# Patient Record
Sex: Female | Born: 2000 | Race: White | Hispanic: No | Marital: Single | Smoking: Former smoker
Health system: Southern US, Community
[De-identification: ages and names within clinical notes are randomized; demographics above are authoritative.]

## PROBLEM LIST (undated history)

## (undated) DIAGNOSIS — F909 Attention-deficit hyperactivity disorder, unspecified type: Secondary | ICD-10-CM

## (undated) DIAGNOSIS — D649 Anemia, unspecified: Secondary | ICD-10-CM

## (undated) DIAGNOSIS — T7840XA Allergy, unspecified, initial encounter: Secondary | ICD-10-CM

## (undated) DIAGNOSIS — D573 Sickle-cell trait: Secondary | ICD-10-CM

## (undated) HISTORY — DX: Allergy, unspecified, initial encounter: T78.40XA

## (undated) HISTORY — PX: NO PAST SURGERIES: SHX2092

## (undated) HISTORY — DX: Attention-deficit hyperactivity disorder, unspecified type: F90.9

---
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
## 2021-02-21 ENCOUNTER — Other Ambulatory Visit: Payer: Self-pay

## 2021-02-21 ENCOUNTER — Telehealth (INDEPENDENT_AMBULATORY_CARE_PROVIDER_SITE_OTHER): Payer: BC Managed Care – PPO | Admitting: Child and Adolescent Psychiatry

## 2021-02-21 DIAGNOSIS — F411 Generalized anxiety disorder: Secondary | ICD-10-CM

## 2021-02-21 MED ORDER — FLUOXETINE HCL 40 MG PO CAPS: ORAL_CAPSULE | ORAL | 0 refills | Status: DC
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
## 2021-03-23 ENCOUNTER — Ambulatory Visit (INDEPENDENT_AMBULATORY_CARE_PROVIDER_SITE_OTHER): Payer: BC Managed Care – PPO

## 2021-03-23 ENCOUNTER — Other Ambulatory Visit: Payer: Self-pay

## 2021-03-23 DIAGNOSIS — Z3042 Encounter for surveillance of injectable contraceptive: Secondary | ICD-10-CM

## 2021-03-23 MED ORDER — MEDROXYPROGESTERONE ACETATE 150 MG/ML IM SUSP
150.0000 mg | Freq: Once | INTRAMUSCULAR | Status: AC
  Administered 2021-03-23: 150 mg via INTRAMUSCULAR

## 2021-03-23 MED ORDER — MEDROXYPROGESTERONE ACETATE 150 MG/ML IM SUSP: 150.0000 mg | Freq: Once | INTRAMUSCULAR | Status: DC

## 2021-03-23 NOTE — Progress Notes (Signed)
Patient presents today for Depo Provera injection within dates. Given IM RUOQ. Patient tolerated well. 

## 2021-04-12 ENCOUNTER — Other Ambulatory Visit: Payer: Self-pay

## 2021-04-12 ENCOUNTER — Telehealth (INDEPENDENT_AMBULATORY_CARE_PROVIDER_SITE_OTHER): Payer: BC Managed Care – PPO | Admitting: Child and Adolescent Psychiatry

## 2021-04-12 DIAGNOSIS — F411 Generalized anxiety disorder: Secondary | ICD-10-CM | POA: Diagnosis not present

## 2021-04-12 NOTE — Progress Notes (Signed)
Virtual Visit via Video Note  I connected with Nicole Branch on 04/12/21 at  8:00 AM EDT by a video enabled telemedicine application and verified that I am speaking with the correct person using two identifiers.  Location: Patient: Home Provider: Office   I discussed the limitations of evaluation and management by telemedicine and the availability of in person appointments. The patient expressed understanding and agreed to proceed.   I discussed the assessment and treatment plan with the patient. The patient was provided an opportunity to ask questions and all were answered. The patient agreed with the plan and demonstrated an understanding of the instructions.   The patient was advised to call back or seek an in-person evaluation if the symptoms worsen or if the condition fails to improve as anticipated.  I provided 20 minutes of non-face-to-face time during this encounter.   Nicole Smalling, MD    [facility] MD/PA/NP OP Progress Note  04/12/2021 8:30 AM Nicole Branch  MRN:  [id number]   Chief Complaint:   Medication management follow-up for anxiety, mood.  HPI: This is a 20 year old Caucasian female with psychiatric history significant of generalized anxiety disorder, ADHD was seen and evaluated over telemedicine encounter for medication management follow-up.   Nicole Branch was accompanied with her mother at her home and was evaluated separately from her mother and jointly.  Nicole Branch gave verbal informed consent to speak with her mother to obtain collateral information and discuss treatment plan.  Her mother also has signed consent from her and will be emailing it to [place].  Nicole Branch denies any new concerns for today's appointment and reports that her brother last increase in Prozac she is feeling better.  When asked what is better she reports that anxiety and stress is less, she has not feeling as "heavy" as before.  She still reports that her anxiety comes back about once a day or every  other day in the context of overthinking especially about her future and over thinking in general.  She reports that she has however noticed a significant difference overall and would like to keep her medication at the same dose for now.  In regards of mood she reports that she is occasionally irritable but not as frequent as it was before.  She reports that she has been sleeping well, her appetite has been good however reports that her energy is still low.  She denies any suicidal thoughts or homicidal thoughts.  She reports that in her free time she hangs out with her friends, with her boyfriend, and has continued to work at [employer].  She denies any problems with her medications.  Her mother reports that she has noticed improvement, reports that Nicole Branch is not as irritable as she was before.  She reports that she has not seen her having outrage or fits of anger as she used to at the last appointment.  She reports that she still has anxiety especially about her future.  We discussed that given stability in her symptoms, continue with current dose of Prozac at 40 mg once a day and follow back and 6 to 8 weeks or earlier if needed.   Visit Diagnosis:    ICD-10-CM   1. Generalized anxiety disorder  F41.1       Past Psychiatric History: reviewed today from last visit and no change.  Past Medical History:  Past Medical History:  Diagnosis Date   ADHD (attention deficit hyperactivity disorder)    Allergies    Anxiety 2019  Asthma 2013   GERD (gastroesophageal reflux disease) 2015   IBS (irritable bowel syndrome) 2021   [facility]   No past surgical history on file.  Family Psychiatric History: As mentioned in initial H&P, reviewed today, no change   Family History:  Family History  Adopted: Yes  Problem Relation Age of Onset   Drug abuse Mother    ADD / ADHD Sister     Social History:  Social History   Socioeconomic History   Marital status: Single    Spouse name: Not on file    Number of children: Not on file   Years of education: Not on file   Highest education level: 9th grade  Occupational History   Not on file  Tobacco Use   Smoking status: Every Day    Packs/day: 0.25    Types: Cigarettes   Smokeless tobacco: Never  Vaping Use   Vaping Use: Never used  Substance and Sexual Activity   Alcohol use: Never    Comment: she does drink    Drug use: Yes    Types: Marijuana    Comment: last week dont want mother to know.    Sexual activity: Yes    Birth control/protection: Injection  Other Topics Concern   Not on file  Social History Narrative   Not on file   Social Determinants of Health   Financial Resource Strain: Not on file  Food Insecurity: Not on file  Transportation Needs: Not on file  Physical Activity: Not on file  Stress: Not on file  Social Connections: Not on file    Allergies: No Known Allergies  Metabolic Disorder Labs: No results found for: HGBA1C, MPG No results found for: PROLACTIN No results found for: CHOL, TRIG, HDL, CHOLHDL, VLDL, LDLCALC No results found for: TSH  Therapeutic Level Labs: No results found for: LITHIUM No results found for: VALPROATE No components found for:  CBMZ  Current Medications: Current Outpatient Medications  Medication Sig Dispense Refill   albuterol (PROVENTIL HFA;VENTOLIN HFA) 108 (90 Base) MCG/ACT inhaler Inhale 2 puffs into the lungs every 6 (six) hours as needed.     FLOVENT HFA 44 MCG/ACT inhaler Inhale 2 puffs into the lungs 2 (two) times daily.  2   FLUoxetine (PROZAC) 40 MG capsule TAKE 1 CAPSULE BY MOUTH EVERY DAY 90 capsule 0   medroxyPROGESTERone Acetate 150 MG/ML SUSY Inject 1 mL (150 mg total) into the muscle every 3 (three) months. 1 mL 3   No current facility-administered medications for this visit.     Musculoskeletal: Strength & Muscle Tone: unable to assess since visit was over the telemedicine. Gait & Station: unable to assess since visit was over the  telemedicine. Patient leans: N/A  Psychiatric Specialty Exam: ROSReview of 12 systems negative except as mentioned in HPI  There were no vitals taken for this visit.There is no height or weight on file to calculate BMI.  General Appearance: Casual  Eye Contact:  Good  Speech:  Clear and Coherent and Normal Rate  Volume:  Normal  Mood:  "better"   Affect:  Appropriate, Congruent, and Full Range  Thought Process:  Goal Directed and Linear  Orientation:  Full (Time, Place, and Person)  Thought Content: Logical   Suicidal Thoughts:  No  Homicidal Thoughts:  No  Memory:  Immediate;   Good Recent;   Good Remote;   Good  Judgement:  Good  Insight:  Good  Psychomotor Activity:  Normal  Concentration:  Concentration: Good and  Attention Span: Good  Recall:  Good  Fund of Knowledge: Good  Language: Good  Akathisia:  No    AIMS (if indicated): not done  Assets:  Communication Skills Desire for Improvement Financial Resources/Insurance Housing Leisure Time Physical Health Social Support Transportation Vocational/Educational  ADL's:  Intact  Cognition: WNL  Sleep:  Good     Screenings:   Assessment and Plan:   This is an 20 year old Caucasian female, currently domiciled with her adoptive parents and twin sister, has psychiatric history significant of disruptive behavior disorder, anxiety and ADHD diagnosed at the age of 13 was not in any psychiatric treatment until 02/2018 referred by her PCP for psychiatric evaluation and med management in 02/2018.   Trialed Concerta - stopped due to loss of appetite and it not being effective, did not want to try anything else. Zoloft titrated up to 75 mg daily but self tapered due to feeling tired but was effective for anxiety. She was started on Prozac and has been responding well.   Reviewed response to current medications.  Joliyah appears to have noticed partial improvement in her anxiety and irritability, wants to continue with current  dose of Prozac.     # Anxiety (chronic,improving) - Continue with prozac  40 mg daily  # ADHD (chronic, stable) - Trialed Concerta, stopped because pt did not find it effective and had low appetite.  - Currently doing well in the school.  - will continue to monitor.     Follow up in three months or early if needed.           Nicole Smalling, MD 04/12/2021, 8:30 AM

## 2021-06-06 ENCOUNTER — Encounter: Payer: Self-pay | Admitting: Child and Adolescent Psychiatry

## 2021-06-06 ENCOUNTER — Other Ambulatory Visit: Payer: Self-pay

## 2021-06-06 ENCOUNTER — Telehealth (INDEPENDENT_AMBULATORY_CARE_PROVIDER_SITE_OTHER): Payer: BC Managed Care – PPO | Admitting: Child and Adolescent Psychiatry

## 2021-06-06 DIAGNOSIS — F411 Generalized anxiety disorder: Secondary | ICD-10-CM

## 2021-06-06 MED ORDER — FLUOXETINE HCL 40 MG PO CAPS: ORAL_CAPSULE | ORAL | 0 refills | Status: DC

## 2021-06-06 NOTE — Progress Notes (Signed)
Virtual Visit via Video Note  I connected with Nicole Branch on 06/06/21 at  8:00 AM EST by a video enabled telemedicine application and verified that I am speaking with the correct person using two identifiers.  Location: Patient: Home Provider: Office   I discussed the limitations of evaluation and management by telemedicine and the availability of in person appointments. The patient expressed understanding and agreed to proceed.   I discussed the assessment and treatment plan with the patient. The patient was provided an opportunity to ask questions and all were answered. The patient agreed with the plan and demonstrated an understanding of the instructions.   The patient was advised to call back or seek an in-person evaluation if the symptoms worsen or if the condition fails to improve as anticipated.  I provided 18 minutes of non-face-to-face time during this encounter.   Nicole Smalling, MD    [facility] MD/PA/NP OP Progress Note  06/06/2021 8:19 AM Nicole Branch  MRN:  [id number]   Chief Complaint:   Medication management follow-up for anxiety and mood.  HPI: This is a 20 year old Caucasian female with psychiatric history significant of generalized anxiety disorder, ADHD was seen and evaluated over telemedicine encounter for medication management follow-up.   Nicole Branch was seen and evaluated over telemedicine encounter for medication management follow-up.  She was evaluated separately from her mother and I spoke with her mother with Nicole Branch's verbal informed consent.  In the interim since last appointment she had 1 emergency room visit for exacerbation of asthma and was positive for influenza.  She was prescribed oral steroids and inhaler and was recommended to follow up with her primary care.  Nicole Branch reports that she is recovering from flu, and overall is feeling better.  She reports that she still has some congestion and has not been eating as much.  She reports that she  is planning to return back to work today.  Overall she reports that she has been doing well, denies any new concerns for today's appointment.  She reports that she is doing well with her mood, still gets irritable at times but it is not frequent or intense as it used to be.  She denies any low lows or depressed mood, denies anhedonia, denies problems with sleep or appetite, denies problems with energy or concentration, denies any suicidal thoughts or homicidal thoughts.  She reports that her anxiety occasionally worsens when there are a lot of clients at her work and rates it around 5 or 6 out of 10, 10 being most anxious.  She reports that she has been able to manage her anxiety well.  She reports that in her free time she likes to play with her dogs and also hangs out with her friends.  She reports that she has been compliant with her medications and denies any side effects from them.  Her mother denies any new concerns for today's appointment and reports that overall Nicole Branch seems to be doing well.  We discussed to continue with fluoxetine 40 mg once a day and follow back again in 2 to 3 months or earlier if needed.  They verbalized understanding and agreed with the plan.    Visit Diagnosis:    ICD-10-CM   1. Generalized anxiety disorder  F41.1 FLUoxetine (PROZAC) 40 MG capsule       Past Psychiatric History: reviewed today from last visit and no change.  Past Medical History:  Past Medical History:  Diagnosis Date   ADHD (attention deficit hyperactivity  disorder)    Allergies    Anxiety 2019   Asthma 2013   GERD (gastroesophageal reflux disease) 2015   IBS (irritable bowel syndrome) 2021   [facility]   No past surgical history on file.  Family Psychiatric History: As mentioned in initial H&P, reviewed today, no change   Family History:  Family History  Adopted: Yes  Problem Relation Age of Onset   Drug abuse Mother    ADD / ADHD Sister     Social History:  Social History    Socioeconomic History   Marital status: Single    Spouse name: Not on file   Number of children: Not on file   Years of education: Not on file   Highest education level: 9th grade  Occupational History   Not on file  Tobacco Use   Smoking status: Every Day    Packs/day: 0.25    Types: Cigarettes   Smokeless tobacco: Never  Vaping Use   Vaping Use: Never used  Substance and Sexual Activity   Alcohol use: Never    Comment: she does drink    Drug use: Yes    Types: Marijuana    Comment: last week dont want mother to know.    Sexual activity: Yes    Birth control/protection: Injection  Other Topics Concern   Not on file  Social History Narrative   Not on file   Social Determinants of Health   Financial Resource Strain: Not on file  Food Insecurity: Not on file  Transportation Needs: Not on file  Physical Activity: Not on file  Stress: Not on file  Social Connections: Not on file    Allergies: No Known Allergies  Metabolic Disorder Labs: No results found for: HGBA1C, MPG No results found for: PROLACTIN No results found for: CHOL, TRIG, HDL, CHOLHDL, VLDL, LDLCALC No results found for: TSH  Therapeutic Level Labs: No results found for: LITHIUM No results found for: VALPROATE No components found for:  CBMZ  Current Medications: Current Outpatient Medications  Medication Sig Dispense Refill   albuterol (PROVENTIL HFA;VENTOLIN HFA) 108 (90 Base) MCG/ACT inhaler Inhale 2 puffs into the lungs every 6 (six) hours as needed.     FLOVENT HFA 44 MCG/ACT inhaler Inhale 2 puffs into the lungs 2 (two) times daily.  2   FLUoxetine (PROZAC) 40 MG capsule TAKE 1 CAPSULE BY MOUTH EVERY DAY 90 capsule 0   medroxyPROGESTERone Acetate 150 MG/ML SUSY Inject 1 mL (150 mg total) into the muscle every 3 (three) months. 1 mL 3   No current facility-administered medications for this visit.     Musculoskeletal: Strength & Muscle Tone: unable to assess since visit was over the  telemedicine. Gait & Station: unable to assess since visit was over the telemedicine. Patient leans: N/A  Psychiatric Specialty Exam: ROSReview of 12 systems negative except as mentioned in HPI  There were no vitals taken for this visit.There is no height or weight on file to calculate BMI.  General Appearance: Casual  Eye Contact:  Good  Speech:  Clear and Coherent and Normal Rate  Volume:  Normal  Mood:  "good"   Affect:  Appropriate, Congruent, and Full Range  Thought Process:  Goal Directed and Linear  Orientation:  Full (Time, Place, and Person)  Thought Content: Logical   Suicidal Thoughts:  No  Homicidal Thoughts:  No  Memory:  Immediate;   Good Recent;   Good Remote;   Good  Judgement:  Good  Insight:  Good  Psychomotor Activity:  Normal  Concentration:  Concentration: Good and Attention Span: Good  Recall:  Good  Fund of Knowledge: Good  Language: Good  Akathisia:  No    AIMS (if indicated): not done  Assets:  Communication Skills Desire for Improvement Financial Resources/Insurance Housing Leisure Time Physical Health Social Support Transportation Vocational/Educational  ADL's:  Intact  Cognition: WNL  Sleep:  Good     Screenings:   Assessment and Plan:   This is an 20 year old Caucasian female, currently domiciled with her adoptive parents and twin sister, has psychiatric history significant of disruptive behavior disorder, anxiety and ADHD diagnosed at the age of 61 was not in any psychiatric treatment until 02/2018 referred by her PCP for psychiatric evaluation and med management in 02/2018.   Trialed Concerta - stopped due to loss of appetite and it not being effective, did not want to try anything else. Zoloft titrated up to 75 mg daily but self tapered due to feeling tired but was effective for anxiety. She was started on Prozac and has been responding well.   Plan reviewed on 06/06/21 -reviewed response to current medication and she appears to  have continued stabilizing her anxiety, mood.  We discussed to continue with current medications and follow back in 2 or 3 months or earlier if needed.     # Anxiety (chronic,improving) - Continue with prozac  40 mg daily  # irritability(chronic and improving) - Continue with Prozac 40 mg daily.   # ADHD (chronic, stable) - Trialed Concerta, stopped because pt did not find it effective and had low appetite.  - Currently doing well. - will continue to monitor.     Follow up in three months or early if needed.      MDM = 2 or more chronic stable conditions + med management        Nicole Smalling, MD 06/06/2021, 8:19 AM

## 2021-06-18 ENCOUNTER — Ambulatory Visit: Payer: BC Managed Care – PPO

## 2021-06-25 ENCOUNTER — Ambulatory Visit (INDEPENDENT_AMBULATORY_CARE_PROVIDER_SITE_OTHER): Payer: BC Managed Care – PPO

## 2021-06-25 ENCOUNTER — Other Ambulatory Visit: Payer: Self-pay

## 2021-06-25 DIAGNOSIS — N912 Amenorrhea, unspecified: Secondary | ICD-10-CM | POA: Diagnosis not present

## 2021-06-25 DIAGNOSIS — Z3042 Encounter for surveillance of injectable contraceptive: Secondary | ICD-10-CM | POA: Diagnosis not present

## 2021-06-25 LAB — POCT URINE PREGNANCY: Preg Test, Ur: NEGATIVE

## 2021-06-25 MED ORDER — MEDROXYPROGESTERONE ACETATE 150 MG/ML IM SUSP
150.0000 mg | Freq: Once | INTRAMUSCULAR | Status: AC
  Administered 2021-06-25: 150 mg via INTRAMUSCULAR

## 2021-06-25 NOTE — Progress Notes (Signed)
Patient presents today for Depo Provera injection outside of date range. Patient not currently on menses. Pregnancy test performed per protocol (negative).Given IM LUOQ. Patient tolerated well. 

## 2021-08-29 ENCOUNTER — Telehealth (INDEPENDENT_AMBULATORY_CARE_PROVIDER_SITE_OTHER): Payer: BC Managed Care – PPO | Admitting: Child and Adolescent Psychiatry

## 2021-08-29 ENCOUNTER — Other Ambulatory Visit: Payer: Self-pay

## 2021-08-29 DIAGNOSIS — F321 Major depressive disorder, single episode, moderate: Secondary | ICD-10-CM | POA: Diagnosis not present

## 2021-08-29 DIAGNOSIS — F411 Generalized anxiety disorder: Secondary | ICD-10-CM

## 2021-08-29 MED ORDER — ESCITALOPRAM OXALATE 5 MG PO TABS: 5.0000 mg | ORAL_TABLET | Freq: Every day | ORAL | 0 refills | Status: DC

## 2021-08-29 NOTE — Progress Notes (Signed)
Virtual Visit via Video Note  I connected with Nicole Branch on 08/29/21 at  8:30 AM EST by a video enabled telemedicine application and verified that I am speaking with the correct person using two identifiers.  Location: Patient: Home Provider: Office   I discussed the limitations of evaluation and management by telemedicine and the availability of in person appointments. The patient expressed understanding and agreed to proceed.   I discussed the assessment and treatment plan with the patient. The patient was provided an opportunity to ask questions and all were answered. The patient agreed with the plan and demonstrated an understanding of the instructions.   The patient was advised to call back or seek an in-person evaluation if the symptoms worsen or if the condition fails to improve as anticipated.  I provided  30 minutes of non-face-to-face time during this encounter.   [name], MD    [facility] MD/PA/NP OP Progress Note  08/29/2021 9:01 AM [name]  MRN:  [id number]   Chief Complaint:   Medication management follow-up for mood and anxiety.  HPI: This is a 21 year old Caucasian female with psychiatric history significant of generalized anxiety disorder, ADHD was seen and evaluated over telemedicine encounter for medication management follow-up.  She was evaluated separately from her mother and I spoke with her mother with Lynnetta's verbal informed consent to obtain collateral information and discuss her treatment plan.  Nicole Branch reports that she has discontinued fluoxetine 40 mg once a day about 3 months ago because of the night sweats.  She reports that she was having frequent night sweats with fluoxetine, she tapered down her fluoxetine and her night sweats have stopped.  However she reports that she has been more irritable, more anxious and depressed since she has discontinued the medication.  She reports that her anxiety is constant, she often over thinks about  things, very anxious at work.  She also reports that small things make her very angry.  Additionally she reports that she does not have energy, low motivation.  She reports that she still enjoys spending time with her boyfriend and her friends, and denies any problems with appetite.  She denies any current SI or HI however reports that she had fleeting suicidal thoughts with plan increasing her mind a couple of times over the last 3 months in the context of getting very angry.  When asked to remember if she had any triggers of getting very angry, she reports that she does not recall any specific triggers for her anger.  She reports that last time she had suicidal thoughts was over a month ago.  She reports that she has continued to work at [employer] as a part-time.  She denies any substance abuse.  She reports that she stays mostly at home when she is not at work but living with her boyfriend at night in a trailer.  Her mother expresses similar concerns as reported by patient.  Mother reports that patient has been more irritable, showing attitude, not motivated, and believes that patient is using marijuana.  Mother reports that in the past couple of months patient has expressed suicidal thoughts over the phone which made her concerned, denies any recent such incidents.  Mother reports this also happened in the context of patient's fighting with her boyfriend.  I discussed with patient and her mother regarding recommendation for trialing Lexapro 5 mg once a day.  Discussed risks, benefits, side effects including but not limited to GI side effects/activation side  effects/black box warning associated with suicidal thoughts.  Patient provided verbal informed consent.  Also recommended individual therapy, patient continues to remain not agreeable with the therapy recommendation but agrees to think about it and let this writer know at the next appointment.  Pt is scheduled for another appointment in 3 weeks or to  call back of earlier appointment if needed.  Visit Diagnosis:    ICD-10-CM   1. Current moderate episode of major depressive disorder without prior episode (HCC)  F32.1 escitalopram (LEXAPRO) 5 MG tablet    2. Generalized anxiety disorder  F41.1 escitalopram (LEXAPRO) 5 MG tablet       Past Psychiatric History: reviewed today from last visit and no change.  Past Medical History:  Past Medical History:  Diagnosis Date   ADHD (attention deficit hyperactivity disorder)    Allergies    Anxiety 2019   Asthma 2013   GERD (gastroesophageal reflux disease) 2015   IBS (irritable bowel syndrome) 2021   [facility]   No past surgical history on file.  Family Psychiatric History: As mentioned in initial H&P, reviewed today, no change   Family History:  Family History  Adopted: Yes  Problem Relation Age of Onset   Drug abuse Mother    ADD / ADHD Sister     Social History:  Social History   Socioeconomic History   Marital status: Single    Spouse name: Not on file   Number of children: Not on file   Years of education: Not on file   Highest education level: 9th grade  Occupational History   Not on file  Tobacco Use   Smoking status: Every Day    Packs/day: 0.25    Types: Cigarettes   Smokeless tobacco: Never  Vaping Use   Vaping Use: Never used  Substance and Sexual Activity   Alcohol use: Never    Comment: she does drink    Drug use: Yes    Types: Marijuana    Comment: last week dont want mother to know.    Sexual activity: Yes    Birth control/protection: Injection  Other Topics Concern   Not on file  Social History Narrative   Not on file   Social Determinants of Health   Financial Resource Strain: Not on file  Food Insecurity: Not on file  Transportation Needs: Not on file  Physical Activity: Not on file  Stress: Not on file  Social Connections: Not on file    Allergies: No Known Allergies  Metabolic Disorder Labs: No results found for: HGBA1C,  MPG No results found for: PROLACTIN No results found for: CHOL, TRIG, HDL, CHOLHDL, VLDL, LDLCALC No results found for: TSH  Therapeutic Level Labs: No results found for: LITHIUM No results found for: VALPROATE No components found for:  CBMZ  Current Medications: Current Outpatient Medications  Medication Sig Dispense Refill   escitalopram (LEXAPRO) 5 MG tablet Take 1 tablet (5 mg total) by mouth daily. 30 tablet 0   albuterol (PROVENTIL HFA;VENTOLIN HFA) 108 (90 Base) MCG/ACT inhaler Inhale 2 puffs into the lungs every 6 (six) hours as needed.     FLOVENT HFA 44 MCG/ACT inhaler Inhale 2 puffs into the lungs 2 (two) times daily.  2   medroxyPROGESTERone Acetate 150 MG/ML SUSY Inject 1 mL (150 mg total) into the muscle every 3 (three) months. 1 mL 3   No current facility-administered medications for this visit.     Musculoskeletal: Strength & Muscle Tone: unable to assess since visit  was over the telemedicine. Gait & Station: unable to assess since visit was over the telemedicine. Patient leans: N/A  Psychiatric Specialty Exam: ROSReview of 12 systems negative except as mentioned in HPI  There were no vitals taken for this visit.There is no height or weight on file to calculate BMI.  General Appearance: Casual  Eye Contact:  Good  Speech:  Clear and Coherent and Normal Rate  Volume:  Normal  Mood:  "ok"   Affect:  Appropriate, Congruent, and Restricted  Thought Process:  Goal Directed and Linear  Orientation:  Full (Time, Place, and Person)  Thought Content: Logical   Suicidal Thoughts:  No  Homicidal Thoughts:  No  Memory:  Immediate;   Good Recent;   Good Remote;   Good  Judgement:  Good  Insight:  Shallow  Psychomotor Activity:  Normal  Concentration:  Concentration: Good and Attention Span: Good  Recall:  Good  Fund of Knowledge: Good  Language: Good  Akathisia:  No    AIMS (if indicated): not done  Assets:  Communication Skills Desire for  Improvement Financial Resources/Insurance Housing Leisure Time Physical Health Social Support Transportation Vocational/Educational  ADL's:  Intact  Cognition: WNL  Sleep:  Good     Screenings:   Assessment and Plan:   This is an 21 year old Caucasian female, currently domiciled with her adoptive parents and twin sister, has psychiatric history significant of disruptive behavior disorder, anxiety and ADHD diagnosed at the age of 15 was not in any psychiatric treatment until 02/2018 referred by her PCP for psychiatric evaluation and med management in 02/2018.   Trialed Concerta - stopped due to loss of appetite and it not being effective, did not want to try anything else. Zoloft titrated up to 75 mg daily but self tapered due to feeling tired but was effective for anxiety. She was started on Prozac and has responded well but self discontinued due to nightsweats.    Plan reviewed on 08/29/21 - she has stopped taking Prozac for the last 3 months and appears to have worsening of anxiety and depression since the discontinuation.  She is agreeable to try Lexapro 5 mg once a day, not agreeable with the therapy recommendation but will think about it.  She will follow back again in 3 weeks or earlier if needed.     # Anxiety (chronic,worse)Depression (worse) - Start Lexapro 5 mg daily  # irritability(chronic and worse) - Start Lexapro 5  mg daily.   # ADHD (chronic, stable) - Trialed Concerta, stopped because pt did not find it effective and had low appetite.  - Currently doing well. - will continue to monitor.     Follow up in three weeks or early if needed.      MDM = 2 or more chronic unstable conditions + med management        [name], MD 08/29/2021, 9:01 AM

## 2021-09-20 ENCOUNTER — Other Ambulatory Visit: Payer: Self-pay | Admitting: Child and Adolescent Psychiatry

## 2021-09-20 DIAGNOSIS — F411 Generalized anxiety disorder: Secondary | ICD-10-CM

## 2021-09-20 DIAGNOSIS — F321 Major depressive disorder, single episode, moderate: Secondary | ICD-10-CM

## 2021-09-21 ENCOUNTER — Telehealth (INDEPENDENT_AMBULATORY_CARE_PROVIDER_SITE_OTHER): Payer: BC Managed Care – PPO | Admitting: Child and Adolescent Psychiatry

## 2021-09-21 ENCOUNTER — Other Ambulatory Visit: Payer: Self-pay

## 2021-09-21 DIAGNOSIS — F411 Generalized anxiety disorder: Secondary | ICD-10-CM

## 2021-09-21 DIAGNOSIS — F324 Major depressive disorder, single episode, in partial remission: Secondary | ICD-10-CM | POA: Diagnosis not present

## 2021-09-21 MED ORDER — ESCITALOPRAM OXALATE 10 MG PO TABS: 10.0000 mg | ORAL_TABLET | Freq: Every day | ORAL | 1 refills | Status: DC

## 2021-09-21 NOTE — Progress Notes (Signed)
Virtual Visit via Video Note  I connected with Nicole Branch on 09/21/21 at  9:00 AM EST by a video enabled telemedicine application and verified that I am speaking with the correct person using two identifiers.  Location: Patient: Home Provider: Office   I discussed the limitations of evaluation and management by telemedicine and the availability of in person appointments. The patient expressed understanding and agreed to proceed.   I discussed the assessment and treatment plan with the patient. The patient was provided an opportunity to ask questions and all were answered. The patient agreed with the plan and demonstrated an understanding of the instructions.   The patient was advised to call back or seek an in-person evaluation if the symptoms worsen or if the condition fails to improve as anticipated.  I provided 20 minutes of non-face-to-face time during this encounter.   Nicole Erm, MD    [facility] MD/PA/NP OP Progress Note  09/21/2021 9:26 AM Nicole Branch  MRN:  [id number]   Chief Complaint:   Medication management follow-up for mood and anxiety.  HPI: This is a 21 year old Caucasian female with psychiatric history significant of generalized anxiety disorder, ADHD was seen and evaluated over telemedicine encounter for medication management follow-up.  She was evaluated separately from her mother and I spoke with her mother with [name]'s verbal informed consent to obtain collateral information and discuss her treatment plan.  Elian reports that she has tolerated Lexapro well and has noticed partial improvement with her anxiety and irritability.  She reports that she still gets night sweats on Lexapro which she feels better and therefore she would like to actually increase the dose so that anxiety and irritability improve more.  She reports that her anxiety is around 5 or 6 out of 10, 10 being most anxious.  She reports that on the medication she has noticed that it is  easier for her to stay calm, not get angry easily and this has helped her at work and with her relationship with her boyfriend.  She denies feeling low lows or having depressed mood.  She reports that she enjoys hanging out with her friends and her boyfriend.  She reports that she has been eating well and sleeping well.  She denies any suicidal thoughts or homicidal thoughts.  We discussed that night sweats might continue or even get worse with the increasing Lexapro.  She verbalized understanding and would like to try Lexapro 10 mg once a day.  She also reports that she has been vaping nicotine for sometime now and finishes 5000 puffs in 2 weeks, also using MJA everyday.  Discussed that nicotine and marijuana may worsen anxiety and irritability.  She reports that she can try cutting down on the usage.  Mother reports that she has seen major improvement since she has started taking Lexapro.  She reports that patient has been much more pleasant to be around, less irritable and anxious and does not look depressed.  She reports that patient wants to go up to 10 mg once a day so that she can have more improvement.  We discussed to increase the dose of Lexapro to 10 mg once a day.  They will be following back again in a month or earlier if needed. Visit Diagnosis:    ICD-10-CM   1. Generalized anxiety disorder  F41.1     2. Major depressive disorder with single episode, in partial remission ([place])  F32.4        Past Psychiatric History:  reviewed today from last visit and no change.  Past Medical History:  Past Medical History:  Diagnosis Date   ADHD (attention deficit hyperactivity disorder)    Allergies    Anxiety 2019   Asthma 2013   GERD (gastroesophageal reflux disease) 2015   IBS (irritable bowel syndrome) 2021   [facility]   No past surgical history on file.  Family Psychiatric History: As mentioned in initial H&P, reviewed today, no change   Family History:  Family History  Adopted:  Yes  Problem Relation Age of Onset   Drug abuse Mother    ADD / ADHD Sister     Social History:  Social History   Socioeconomic History   Marital status: Single    Spouse name: Not on file   Number of children: Not on file   Years of education: Not on file   Highest education level: 9th grade  Occupational History   Not on file  Tobacco Use   Smoking status: Every Day    Packs/day: 0.25    Types: Cigarettes   Smokeless tobacco: Never  Vaping Use   Vaping Use: Never used  Substance and Sexual Activity   Alcohol use: Never    Comment: she does drink    Drug use: Yes    Types: Marijuana    Comment: last week dont want mother to know.    Sexual activity: Yes    Birth control/protection: Injection  Other Topics Concern   Not on file  Social History Narrative   Not on file   Social Determinants of Health   Financial Resource Strain: Not on file  Food Insecurity: Not on file  Transportation Needs: Not on file  Physical Activity: Not on file  Stress: Not on file  Social Connections: Not on file    Allergies: No Known Allergies  Metabolic Disorder Labs: No results found for: HGBA1C, MPG No results found for: PROLACTIN No results found for: CHOL, TRIG, HDL, CHOLHDL, VLDL, LDLCALC No results found for: TSH  Therapeutic Level Labs: No results found for: LITHIUM No results found for: VALPROATE No components found for:  CBMZ  Current Medications: Current Outpatient Medications  Medication Sig Dispense Refill   albuterol (PROVENTIL HFA;VENTOLIN HFA) 108 (90 Base) MCG/ACT inhaler Inhale 2 puffs into the lungs every 6 (six) hours as needed.     escitalopram (LEXAPRO) 5 MG tablet Take 1 tablet (5 mg total) by mouth daily. 30 tablet 0   FLOVENT HFA 44 MCG/ACT inhaler Inhale 2 puffs into the lungs 2 (two) times daily.  2   medroxyPROGESTERone Acetate 150 MG/ML SUSY Inject 1 mL (150 mg total) into the muscle every 3 (three) months. 1 mL 3   No current  facility-administered medications for this visit.     Musculoskeletal: Strength & Muscle Tone: unable to assess since visit was over the telemedicine. Gait & Station: unable to assess since visit was over the telemedicine. Patient leans: N/A  Psychiatric Specialty Exam: ROSReview of 12 systems negative except as mentioned in HPI  There were no vitals taken for this visit.There is no height or weight on file to calculate BMI.  General Appearance: Casual  Eye Contact:  Good  Speech:  Clear and Coherent and Normal Rate  Volume:  Normal  Mood:  "good.."   Affect:  Appropriate, Congruent, and Full Range  Thought Process:  Goal Directed and Linear  Orientation:  Full (Time, Place, and Person)  Thought Content: Logical   Suicidal Thoughts:  No  Homicidal Thoughts:  No  Memory:  Immediate;   Good Recent;   Good Remote;   Good  Judgement:  Good  Insight:  Shallow  Psychomotor Activity:  Normal  Concentration:  Concentration: Good and Attention Span: Good  Recall:  Good  Fund of Knowledge: Good  Language: Good  Akathisia:  No    AIMS (if indicated): not done  Assets:  Communication Skills Desire for Improvement Financial Resources/Insurance Housing Leisure Time Physical Health Social Support Transportation Vocational/Educational  ADL's:  Intact  Cognition: WNL  Sleep:   Good     Screenings:   Assessment and Plan:   This is an 21 year old Caucasian female, currently domiciled with her adoptive parents and twin sister, has psychiatric history significant of disruptive behavior disorder, anxiety and ADHD diagnosed at the age of 71 was not in any psychiatric treatment until 02/2018 referred by her PCP for psychiatric evaluation and med management in 02/2018.   Trialed Concerta - stopped due to loss of appetite and it not being effective, did not want to try anything else. Zoloft titrated up to 75 mg daily but self tapered due to feeling tired but was effective for anxiety.  She was started on Prozac and has responded well but self discontinued due to nightsweats.    Plan reviewed on 09/21/2021 - She appears to have partial improvement in anxiety and depression since she has started taking Lexapro.  Increasing the dose due to partial improvement. Psychoeducation was provided on nicotine and cannabis abuse and recommended reducing the usage.    # Anxiety (chronic, partially better)Depression (improving) - Increase Lexapro to 10 mg daily  # irritability(chronic and worse) - Lexapro 10  mg daily.   # ADHD (chronic, stable) - Trialed Concerta, stopped because pt did not find it effective and had low appetite.  - Currently doing well. - will continue to monitor.   # Nicotine and Cannabis abuse - Motivational interviewing, psychoeducation.     Follow up in three weeks or early if needed.      MDM = 2 or more chronic unstable conditions + med management        Nicole Erm, MD 09/21/2021, 9:26 AM

## 2021-10-02 ENCOUNTER — Ambulatory Visit: Payer: BC Managed Care – PPO

## 2021-10-03 ENCOUNTER — Ambulatory Visit (INDEPENDENT_AMBULATORY_CARE_PROVIDER_SITE_OTHER): Payer: BC Managed Care – PPO

## 2021-10-03 ENCOUNTER — Other Ambulatory Visit: Payer: Self-pay

## 2021-10-03 DIAGNOSIS — Z3042 Encounter for surveillance of injectable contraceptive: Secondary | ICD-10-CM | POA: Diagnosis not present

## 2021-10-03 LAB — POCT URINE PREGNANCY: Preg Test, Ur: NEGATIVE

## 2021-10-03 MED ORDER — MEDROXYPROGESTERONE ACETATE 150 MG/ML IM SUSP
150.0000 mg | Freq: Once | INTRAMUSCULAR | Status: AC
  Administered 2021-10-03: 150 mg via INTRAMUSCULAR

## 2021-10-03 NOTE — Progress Notes (Signed)
Pt here for depo 1 1/2 wks late; negative urine preg test; depo given IM right glut.  Pt tolerated well even though she said it hurt before I gave it; when asked what hurt she said the needle and the medicine hurt; after I gave it she said she didn't feel it at all.  NDC# [phone] ?

## 2021-10-30 ENCOUNTER — Telehealth (INDEPENDENT_AMBULATORY_CARE_PROVIDER_SITE_OTHER): Payer: BC Managed Care – PPO | Admitting: Child and Adolescent Psychiatry

## 2021-10-30 DIAGNOSIS — F3341 Major depressive disorder, recurrent, in partial remission: Secondary | ICD-10-CM | POA: Diagnosis not present

## 2021-10-30 DIAGNOSIS — F411 Generalized anxiety disorder: Secondary | ICD-10-CM | POA: Diagnosis not present

## 2021-10-30 NOTE — Progress Notes (Signed)
Virtual Visit via Video Note ? ?I connected with Nicole Branch on 10/30/21 at  8:00 AM EDT by a video enabled telemedicine application and verified that I am speaking with the correct person using two identifiers. ? ?Location: ?Patient: Home ?Provider: Office ?  ?I discussed the limitations of evaluation and management by telemedicine and the availability of in person appointments. The patient expressed understanding and agreed to proceed. ?  ?I discussed the assessment and treatment plan with the patient. The patient was provided an opportunity to ask questions and all were answered. The patient agreed with the plan and demonstrated an understanding of the instructions. ?  ?The patient was advised to call back or seek an in-person evaluation if the symptoms worsen or if the condition fails to improve as anticipated. ? ?I provided 20 minutes of non-face-to-face time during this encounter. ? ? ?Nicole Erm, MD ? ? ? ?BH MD/PA/NP OP Progress Note ? ?10/30/2021 8:30 AM ?Nicole Branch  ?MRN:  [id number]  ? ?Chief Complaint:   Medication management follow-up for mood and anxiety. ? ?HPI: This is a 21 year old Caucasian female with psychiatric history significant of generalized anxiety disorder, ADHD was seen and evaluated over telemedicine encounter for medication management follow-up.  She was evaluated separately from her mother and I spoke with her mother with Nicole Branch's verbal informed consent to obtain collateral information and discuss her treatment plan. ? ?Nicole Branch reports that she is doing well, tolerated increased dose of Lexapro well without any problems and has noticed improvement with her anxiety.  She reports that she does not have as much of her worried feeling as she used to have before, she is also less irritable, overall mood is positive, denies any low lows or depressed mood.  She reports that her anxiety is not completely better however she would prefer to continue the same dose and see how  things go over the next month or 2.  She reports that she has been doing well at her work, started a second job at [employer], doing well with her boyfriend and her mother.  Denies any problems with sleep or appetite or energy.  Denies any suicidal thoughts or homicidal thoughts.  She continues to vape nicotine but improving also reports that she has reduced using cannabis to about once a week. ? ?Her mother also denies any new concerns for today's appointment and reports that she has noticed improvement since the last appointment.  She reports that patient has more positive attitude and less of irritability. ? ?I discussed with the patient and her mother to continue with Lexapro 10 mg once a day and follow back in 6 weeks or earlier if needed.  They verbalized understanding and agreed with the plan. ?Visit Diagnosis:  ?  ICD-10-CM   ?1. Recurrent major depressive disorder, in partial remission (HCC)  F33.41   ?  ?2. Generalized anxiety disorder  F41.1   ?  ? ? ? ? ?Past Psychiatric History: reviewed today from last visit and no change.  ?Past Medical History:  ?Past Medical History:  ?Diagnosis Date  ? ADHD (attention deficit hyperactivity disorder)   ? Allergies   ? Anxiety 2019  ? Asthma 2013  ? GERD (gastroesophageal reflux disease) 2015  ? IBS (irritable bowel syndrome) 2021  ? [facility]  ? No past surgical history on file. ? ?Family Psychiatric History: As mentioned in initial H&P, reviewed today, no change  ? ?Family History:  ?Family History  ?Adopted: Yes  ?  Problem Relation Age of Onset  ? Drug abuse Mother   ? ADD / ADHD Sister   ? ? ?Social History:  ?Social History  ? ?Socioeconomic History  ? Marital status: Single  ?  Spouse name: Not on file  ? Number of children: Not on file  ? Years of education: Not on file  ? Highest education level: 9th grade  ?Occupational History  ? Not on file  ?Tobacco Use  ? Smoking status: Every Day  ?  Packs/day: 0.25  ?  Types: Cigarettes  ? Smokeless tobacco:  Never  ?Vaping Use  ? Vaping Use: Never used  ?Substance and Sexual Activity  ? Alcohol use: Never  ?  Comment: she does drink   ? Drug use: Yes  ?  Types: Marijuana  ?  Comment: last week dont want mother to know.   ? Sexual activity: Yes  ?  Birth control/protection: Injection  ?Other Topics Concern  ? Not on file  ?Social History Narrative  ? Not on file  ? ?Social Determinants of Health  ? ?Financial Resource Strain: Not on file  ?Food Insecurity: Not on file  ?Transportation Needs: Not on file  ?Physical Activity: Not on file  ?Stress: Not on file  ?Social Connections: Not on file  ? ? ?Allergies: No Known Allergies ? ?Metabolic Disorder Labs: ?No results found for: HGBA1C, MPG ?No results found for: PROLACTIN ?No results found for: CHOL, TRIG, HDL, CHOLHDL, VLDL, LDLCALC ?No results found for: TSH ? ?Therapeutic Level Labs: ?No results found for: LITHIUM ?No results found for: VALPROATE ?No components found for:  CBMZ ? ?Current Medications: ?Current Outpatient Medications  ?Medication Sig Dispense Refill  ? albuterol (PROVENTIL HFA;VENTOLIN HFA) 108 (90 Base) MCG/ACT inhaler Inhale 2 puffs into the lungs every 6 (six) hours as needed.    ? escitalopram (LEXAPRO) 10 MG tablet Take 1 tablet (10 mg total) by mouth daily. 90 tablet 1  ? FLOVENT HFA 44 MCG/ACT inhaler Inhale 2 puffs into the lungs 2 (two) times daily.  2  ? medroxyPROGESTERone Acetate 150 MG/ML SUSY Inject 1 mL (150 mg total) into the muscle every 3 (three) months. 1 mL 3  ? ?No current facility-administered medications for this visit.  ? ? ? ?Musculoskeletal: ?Strength & Muscle Tone: unable to assess since visit was over the telemedicine. ?Gait & Station: unable to assess since visit was over the telemedicine. ?Patient leans: N/A ? ?Psychiatric Specialty Exam: ?ROSReview of 12 systems negative except as mentioned in HPI  ?There were no vitals taken for this visit.There is no height or weight on file to calculate BMI.  ?General Appearance: Casual   ?Eye Contact:  Good  ?Speech:  Clear and Coherent and Normal Rate  ?Volume:  Normal  ?Mood:  "good..." ?  ?Affect:  Appropriate, Congruent, and Full Range  ?Thought Process:  Goal Directed and Linear  ?Orientation:  Full (Time, Place, and Person)  ?Thought Content: Logical   ?Suicidal Thoughts:  No  ?Homicidal Thoughts:  No  ?Memory:  Immediate;   Good ?Recent;   Good ?Remote;   Good  ?Judgement:  Good  ?Insight:  Shallow  ?Psychomotor Activity:  Normal  ?Concentration:  Concentration: Good and Attention Span: Good  ?Recall:  Good  ?Fund of Knowledge: Good  ?Language: Good  ?Akathisia:  No  ?  ?AIMS (if indicated): not done  ?Assets:  Communication Skills ?Desire for Improvement ?Financial Resources/Insurance ?Housing ?Leisure Time ?Physical Health ?Social Support ?Transportation ?Vocational/Educational  ?ADL's:  Intact  ?  Cognition: WNL  ?Sleep:   ?Good  ?  ? ?Screenings: ? ? ?Assessment and Plan:  ? ?This is an 21 year old Caucasian female, currently domiciled with her adoptive parents and twin sister, has psychiatric history significant of disruptive behavior disorder, anxiety and ADHD diagnosed at the age of 87 was not in any psychiatric treatment until 02/2018 referred by her PCP for psychiatric evaluation and med management in 02/2018.  ? ?Trialed Concerta - stopped due to loss of appetite and it not being effective, did not want to try anything else. Zoloft titrated up to 75 mg daily but self tapered due to feeling tired but was effective for anxiety. She was started on Prozac and has responded well but self discontinued due to nightsweats.   ? ?Plan reviewed on 10/30/2021 -patient appears to be tolerating increased dose of Lexapro well with improvement.  Continue current medications and follow back in 6 weeks or earlier if needed.  Psychoeducation was provided on nicotine and cannabis use and she reports that she is currently working on reducing the use.   ?  ?# Anxiety (chronic, partially better)Depression  (in early remission) ?- Continue with Lexapro 10 mg daily ? ?# irritability(chronic and improving) ?- Lexapro 10  mg daily.  ? ?# ADHD (chronic, stable) ?- Trialed Concerta, stopped because pt did not f

## 2021-11-24 ENCOUNTER — Emergency Department (EMERGENCY_DEPARTMENT_HOSPITAL)
Admission: EM | Admit: 2021-11-24 | Discharge: 2021-11-25 | Disposition: A | Discharge status: Other Acute Inpatient Hospital | Payer: BC Managed Care – PPO | Source: Home / Self Care | Attending: Emergency Medicine | Admitting: Emergency Medicine

## 2021-11-24 ENCOUNTER — Other Ambulatory Visit: Payer: Self-pay

## 2021-11-24 DIAGNOSIS — T450X1A Poisoning by antiallergic and antiemetic drugs, accidental (unintentional), initial encounter: Secondary | ICD-10-CM | POA: Insufficient documentation

## 2021-11-24 DIAGNOSIS — F419 Anxiety disorder, unspecified: Secondary | ICD-10-CM | POA: Insufficient documentation

## 2021-11-24 DIAGNOSIS — T485X2A Poisoning by other anti-common-cold drugs, intentional self-harm, initial encounter: Secondary | ICD-10-CM | POA: Insufficient documentation

## 2021-11-24 DIAGNOSIS — X838XXA Intentional self-harm by other specified means, initial encounter: Secondary | ICD-10-CM | POA: Insufficient documentation

## 2021-11-24 DIAGNOSIS — T50902A Poisoning by unspecified drugs, medicaments and biological substances, intentional self-harm, initial encounter: Secondary | ICD-10-CM

## 2021-11-24 DIAGNOSIS — Z20822 Contact with and (suspected) exposure to covid-19: Secondary | ICD-10-CM | POA: Insufficient documentation

## 2021-11-24 DIAGNOSIS — T1491XA Suicide attempt, initial encounter: Secondary | ICD-10-CM | POA: Insufficient documentation

## 2021-11-24 DIAGNOSIS — F429 Obsessive-compulsive disorder, unspecified: Secondary | ICD-10-CM | POA: Insufficient documentation

## 2021-11-24 DIAGNOSIS — F909 Attention-deficit hyperactivity disorder, unspecified type: Secondary | ICD-10-CM | POA: Diagnosis present

## 2021-11-24 DIAGNOSIS — F321 Major depressive disorder, single episode, moderate: Secondary | ICD-10-CM | POA: Diagnosis not present

## 2021-11-24 DIAGNOSIS — T450X2A Poisoning by antiallergic and antiemetic drugs, intentional self-harm, initial encounter: Secondary | ICD-10-CM | POA: Diagnosis not present

## 2021-11-24 DIAGNOSIS — F902 Attention-deficit hyperactivity disorder, combined type: Secondary | ICD-10-CM | POA: Diagnosis not present

## 2021-11-24 LAB — URINE DRUG SCREEN, QUALITATIVE (ARMC ONLY)
Amphetamines, Ur Screen: NOT DETECTED
Barbiturates, Ur Screen: NOT DETECTED
Benzodiazepine, Ur Scrn: NOT DETECTED
Cannabinoid 50 Ng, Ur ~~LOC~~: POSITIVE — AB
Cocaine Metabolite,Ur ~~LOC~~: NOT DETECTED
MDMA (Ecstasy)Ur Screen: NOT DETECTED
Methadone Scn, Ur: NOT DETECTED
Opiate, Ur Screen: NOT DETECTED
Phencyclidine (PCP) Ur S: NOT DETECTED
Tricyclic, Ur Screen: POSITIVE — AB

## 2021-11-24 LAB — RESP PANEL BY RT-PCR (FLU A&B, COVID) ARPGX2
Influenza A by PCR: NEGATIVE
Influenza B by PCR: NEGATIVE
SARS Coronavirus 2 by RT PCR: NEGATIVE

## 2021-11-24 LAB — COMPREHENSIVE METABOLIC PANEL
ALT: 20 U/L (ref 0–44)
AST: 31 U/L (ref 15–41)
Albumin: 4.7 g/dL (ref 3.5–5.0)
Alkaline Phosphatase: 65 U/L (ref 38–126)
Anion gap: 9 (ref 5–15)
BUN: 16 mg/dL (ref 6–20)
CO2: 24 mmol/L (ref 22–32)
Calcium: 9.3 mg/dL (ref 8.9–10.3)
Chloride: 108 mmol/L (ref 98–111)
Creatinine, Ser: 0.88 mg/dL (ref 0.44–1.00)
GFR, Estimated: >60 mL/min (ref >60)
Glucose, Bld: 87 mg/dL (ref 70–99)
Potassium: 3.7 mmol/L (ref 3.5–5.1)
Sodium: 141 mmol/L (ref 135–145)
Total Bilirubin: 0.7 mg/dL (ref 0.3–1.2)
Total Protein: 7.7 g/dL (ref 6.5–8.1)

## 2021-11-24 LAB — CBC
HCT: 41 % (ref 36.0–46.0)
Hemoglobin: 13.3 g/dL (ref 12.0–15.0)
MCH: 26.8 pg (ref 26.0–34.0)
MCHC: 32.4 g/dL (ref 30.0–36.0)
MCV: 82.5 fL (ref 80.0–100.0)
Platelets: 297 10*3/uL (ref 150–400)
RBC: 4.97 MIL/uL (ref 3.87–5.11)
RDW: 13.4 % (ref 11.5–15.5)
WBC: 9.1 10*3/uL (ref 4.0–10.5)
nRBC: 0 % (ref 0.0–0.2)

## 2021-11-24 LAB — POC URINE PREG, ED: Preg Test, Ur: NEGATIVE

## 2021-11-24 LAB — ACETAMINOPHEN LEVEL: Acetaminophen (Tylenol), Serum: <10 ug/mL — ABNORMAL LOW (ref 10–30)

## 2021-11-24 LAB — ETHANOL: Alcohol, Ethyl (B): <10 mg/dL (ref <10)

## 2021-11-24 LAB — SALICYLATE LEVEL: Salicylate Lvl: <7 mg/dL — ABNORMAL LOW (ref 7.0–30.0)

## 2021-11-24 NOTE — ED Notes (Signed)
ED Provider at bedside. 

## 2021-11-24 NOTE — ED Provider Notes (Signed)
? ?[facility] ?Provider Note ? ? Event Date/Time  ? First MD Initiated Contact with Patient 11/24/21 2153   ?  (approximate) ?History  ?Drug Overdose ? ?HPI ?Nicole Branch is a 21 y.o. female with a past medical history of anxiety and obsessive-compulsive disorder who presents after ingestion of Listerine, DayQuil, and Benadryl at approximately 5 PM.  Patient states that this was an attempt to commit suicide.  Patient denies any previous suicide attempts in the past.  Patient currently denies any suicidal ideation, homicidal ideation, or auditory/visual hallucinations.  Patient denies any other illicit drug use. ?Physical Exam  ?Triage Vital Signs: ?ED Triage Vitals  ?Enc Vitals Group  ?   BP 11/24/21 2155 120/87  ?   Pulse Rate 11/24/21 2155 89  ?   Resp 11/24/21 2155 (!) 24  ?   Temp 11/24/21 2156 98.6 ?F (37 ?C)  ?   Temp Source 11/24/21 2155 Oral  ?   SpO2 11/24/21 2155 100 %  ?   Weight 11/24/21 2147 132 lb (59.9 kg)  ?   Height 11/24/21 2147 5\' 3"  (1.6 m)  ?   Head Circumference --   ?   Peak Flow --   ?   Pain Score 11/24/21 2147 0  ?   Pain Loc --   ?   Pain Edu? --   ?   Excl. in GC? --   ? ?Most recent vital signs: ?Vitals:  ? 11/24/21 2245 11/24/21 2300  ?BP:  (!) 119/96  ?Pulse: 68 94  ?Resp: (!) 21 18  ?Temp:    ?SpO2: 100% 100%  ? ?General: Awake, oriented x4. ?CV:  Good peripheral perfusion.  ?Resp:  Normal effort.  ?Abd:  No distention.  ?Other:  Young adult Caucasian female laying in bed in no distress ?ED Results / Procedures / Treatments  ?Labs ?(all labs ordered are listed, but only abnormal results are displayed) ?Labs Reviewed  ?SALICYLATE LEVEL - Abnormal; Notable for the following components:  ?    Result Value  ? Salicylate Lvl <7.0 (*)   ? All other components within normal limits  ?ACETAMINOPHEN LEVEL - Abnormal; Notable for the following components:  ? Acetaminophen (Tylenol), Serum <10 (*)   ? All other components within normal limits  ?URINE DRUG SCREEN,  QUALITATIVE ([facility] ONLY) - Abnormal; Notable for the following components:  ? Tricyclic, Ur Screen POSITIVE (*)   ? Cannabinoid 50 Ng, Ur [place] POSITIVE (*)   ? All other components within normal limits  ?RESP PANEL BY RT-PCR (FLU A&B, COVID) [id number]  ?COMPREHENSIVE METABOLIC PANEL  ?ETHANOL  ?CBC  ?POC URINE PREG, ED  ? ?EKG ?ED ECG REPORT ?I, 01/24/22, the attending physician, personally viewed and interpreted this ECG. ?Date: 11/24/2021 ?EKG Time: 2156 ?Rate: 87 ?Rhythm: normal sinus rhythm ?QRS Axis: normal ?Intervals: normal ?ST/T Wave abnormalities: normal ?Narrative Interpretation: no evidence of acute ischemia ?PROCEDURES: ?Critical Care performed: No ?Procedures ?MEDICATIONS ORDERED IN ED: ?Medications - No data to display ?IMPRESSION / MDM / ASSESSMENT AND PLAN / ED COURSE  ?I reviewed the triage vital signs and the nursing notes. ?             ?               ?The patient is on the cardiac monitor to evaluate for evidence of arrhythmia and/or significant heart rate changes. ?Patient is a 21 year old female who presents after an intentional ingestion of multiple substances in an  attempt to end her life. ?Thoughts are linear and organized, and patient has no SI, AH, VH, or HI. ?Prior suicide attempt: Denies ?Prior Psychiatric Hospitalizations: Denies ? ?Clinically patient displays no overt toxidrome; they are well appearing, with low suspicion for toxic ingestion given history and exam. ?Thoughts unlikely 2/2 anemia, hypothyroidism, infection, or ICH. ? ?Consult: Psychiatry to evaluate patient for potential hold for danger to self. ?Disposition: Care of this patient will be signed out to the oncoming physician at the end of my shift.  All pertinent patient information conveyed and all questions answered.  All further care and disposition decisions will be made by the oncoming physician. ? ?  ?FINAL CLINICAL IMPRESSION(S) / ED DIAGNOSES  ? ?Final diagnoses:  ?Suicide attempt [facility])  ?Intentional overdose,  initial encounter [facility])  ? ?Rx / DC Orders  ? ?ED Discharge Orders   ? ? None  ? ?  ? ?Note:  This document was prepared using Dragon voice recognition software and may include unintentional dictation errors. ?  ?Merwyn Katos, MD ?11/24/21 2324 ? ?

## 2021-11-24 NOTE — ED Triage Notes (Signed)
Pt states she drank listerine, took day quil and benadryl to harm her self. Pt arrives with bpd, no ivc papers in place. Ems states pt took 7-25mg benadryl, listerine, 6ounces of dayquil.  ?

## 2021-11-24 NOTE — BH Assessment (Signed)
Comprehensive Clinical Assessment (CCA) Note ? ?11/24/2021 ?Nicole Branch ?[id number] ? ?Chief Complaint:  ?Chief Complaint  ?Patient presents with  ? Drug Overdose  ? ?Dot Lanes arrived to the ED by way of EMS.  She identified, ?I was gonna kill myself and I tried?. She shared, ?I took a bunch of medicine?Marland Kitchen  She reports that ?I feel everything building up and it hit me at one time?.  She identified that school and work are causing her stress.  She reports feelings of hopelessness, ?I just feel that I am not where I should be?Marland Kitchen  She reports that she has been feeling anxious lately.  She shared that she has been worrying about the future.  She reports difficulty concentrating.  Increased worrying is reported, feeling as though things will not work out.  She denied having auditory or visual hallucinations.  She denied homicidal ideation or intent.  She denied the use of alcohol or drugs. She reports that she no longer is feeling suicidal at this time.  She reports that she is being seen by psychiatry at [facility]). She carries a prior diagnosis of Anxiety.  She reports that she has been struggling with anxiety for years.  She shared that she is currently taking Lexapro for anxiety.   ? ?Mother reports that Dot Lanes has a history of stating that she would commit suicide, but states that this is her first known attempt.   ? ? ?Visit Diagnosis: Major Depressive Disorder, Anxiety ? ? ?CCA Screening, Triage and Referral (STR) ? ?Patient Reported Information ?How did you hear about [place]? No data recorded ?Referral name: No data recorded ?Referral phone number: No data recorded ? ?Whom do you see for routine medical problems? No data recorded ?Practice/Facility Name: No data recorded ?Practice/Facility Phone Number: No data recorded ?Name of Contact: No data recorded ?Contact Number: No data recorded ?Contact Fax Number: No data recorded ?Prescriber Name: No data recorded ?Prescriber Address (if known): No data  recorded ? ?What Is the Reason for Your Visit/Call Today? No data recorded ?How Long Has This Been Causing You Problems? No data recorded ?What Do You Feel Would Help You the Most Today? No data recorded ? ?Have You Recently Been in Any Inpatient Treatment (Hospital/Detox/Crisis Center/28-Day Program)? No data recorded ?Name/Location of Program/Hospital:No data recorded ?How Long Were You There? No data recorded ?When Were You Discharged? No data recorded ? ?Have You Ever Received Services From [employer] Before? No data recorded ?Who Do You See at [facility]? No data recorded ? ?Have You Recently Had Any Thoughts About Hurting Yourself? No data recorded ?Are You Planning to Commit Suicide/Harm Yourself At This time? No data recorded ? ?Have you Recently Had Thoughts About Hurting Someone Karolee Ohs? No data recorded ?Explanation: No data recorded ? ?Have You Used Any Alcohol or Drugs in the Past 24 Hours? No data recorded ?How Long Ago Did You Use Drugs or Alcohol? No data recorded ?What Did You Use and How Much? No data recorded ? ?Do You Currently Have a Therapist/Psychiatrist? No data recorded ?Name of Therapist/Psychiatrist: No data recorded ? ?Have You Been Recently Discharged From Any Office Practice or Programs? No data recorded ?Explanation of Discharge From Practice/Program: No data recorded ? ?  ?CCA Screening Triage Referral Assessment ?Type of Contact: No data recorded ?Is this Initial or Reassessment? No data recorded ?Date Telepsych consult ordered in [facility]:  No data recorded ?Time Telepsych consult ordered in [facility]:  No data recorded ? ?Patient Reported Information Reviewed?  No data recorded ?Patient Left Without Being Seen? No data recorded ?Reason for Not Completing Assessment: No data recorded ? ?Collateral Involvement: No data recorded ? ?Does Patient Have a Automotive engineer Guardian? No data recorded ?Name and Contact of Legal Guardian: No data recorded ?If Minor and Not Living with Parent(s), Who  has Custody? No data recorded ?Is CPS involved or ever been involved? No data recorded ?Is APS involved or ever been involved? No data recorded ? ?Patient Determined To Be At Risk for Harm To Self or Others Based on Review of Patient Reported Information or Presenting Complaint? No data recorded ?Method: No data recorded ?Availability of Means: No data recorded ?Intent: No data recorded ?Notification Required: No data recorded ?Additional Information for Danger to Others Potential: No data recorded ?Additional Comments for Danger to Others Potential: No data recorded ?Are There Guns or Other Weapons in Your Home? No data recorded ?Types of Guns/Weapons: No data recorded ?Are These Weapons Safely Secured?                            No data recorded ?Who Could Verify You Are Able To Have These Secured: No data recorded ?Do You Have any Outstanding Charges, Pending Court Dates, Parole/Probation? No data recorded ?Contacted To Inform of Risk of Harm To Self or Others: No data recorded ? ?Location of Assessment: No data recorded ? ?Does Patient Present under Involuntary Commitment? No data recorded ?IVC Papers Initial File Date: No data recorded ? ?[place] of Residence: No data recorded ? ?Patient Currently Receiving the Following Services: No data recorded ? ?Determination of Need: No data recorded ? ?Options For Referral: No data recorded ? ? ? ?CCA Biopsychosocial ?Intake/Chief Complaint:  No data recorded ?Current Symptoms/Problems: No data recorded ? ?Patient Reported Schizophrenia/Schizoaffective Diagnosis in Past: No data recorded ? ?Strengths: No data recorded ?Preferences: No data recorded ?Abilities: No data recorded ? ?Type of Services Patient Feels are Needed: No data recorded ? ?Initial Clinical Notes/Concerns: No data recorded ? ?Mental Health Symptoms ?Depression:   ?Change in energy/activity; Hopelessness; Worthlessness; Irritability; Difficulty Concentrating ?  ?Duration of Depressive symptoms:  ?Less  than two weeks ?  ?Mania:   ?None ?  ?Anxiety:    ?Difficulty concentrating; Worrying; Irritability ?  ?Psychosis:   ?None ?  ?Duration of Psychotic symptoms: No data recorded  ?Trauma:   ?None ?  ?Obsessions:   ?None ?  ?Compulsions:   ?None ?  ?Inattention:   ?None ?  ?Hyperactivity/Impulsivity:   ?None ?  ?Oppositional/Defiant Behaviors:   ?None ?  ?Emotional Irregularity:   ?None ?  ?Other Mood/Personality Symptoms:  No data recorded  ? ?Mental Status Exam ?Appearance and self-care  ?Stature:   ?Average ?  ?Weight:   ?Thin ?  ?Clothing:  No data recorded  ?Grooming:   ?Normal ?  ?Cosmetic use:   ?None ?  ?Posture/gait:   ?Normal ?  ?Motor activity:   ?Not Remarkable ?  ?Sensorium  ?Attention:   ?Normal ?  ?Concentration:   ?Normal ?  ?Orientation:   ?X5 ?  ?Recall/memory:   ?Normal ?  ?Affect and Mood  ?Affect:   ?Anxious ?  ?Mood:   ?Irritable ?  ?Relating  ?Eye contact:   ?Fleeting ?  ?Facial expression:   ?Tense ?  ?Attitude toward examiner:   ?Cooperative ?  ?Thought and Language  ?Speech flow:  ?Soft ?  ?Thought content:   ?Appropriate to Mood and Circumstances ?  ?  Preoccupation:   ?None ?  ?Hallucinations:   ?None ?  ?Organization:  No data recorded  ?Executive Functions  ?Fund of Knowledge:   ?Average ?  ?Intelligence:   ?Average ?  ?Abstraction:   ?Normal ?  ?Judgement:  No data recorded  ?Reality Testing:  No data recorded  ?Insight:   ?Fair ?  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  ?Coping Ability:   ?Overwhelmed ?  ?Skill Deficits:  No data recorded  ?Supports:  No data recorded  ? ? ?Religion: ?Religion/Spirituality ?Are You A Religious Person?: Yes ?What is Your Religious Affiliation?: Baptist ? ?Leisure/Recreation: ?Leisure / Recreation ?Do You Have Hobbies?: Yes ?Leisure and Hobbies: Fishing, hunting, swimming ? ?Exercise/Diet: ?Exercise/Diet ?Do You Exercise?:  Yes ?How Many Times a Week Do You Exercise?: 4-5 times a week ?Do You Follow a Special Diet?: No ?Do You Have Any Trouble Sleeping?: No ? ? ?CCA Employment/Education ?Employment/Work Situation: ?Employment / Work S

## 2021-11-25 ENCOUNTER — Other Ambulatory Visit: Payer: Self-pay

## 2021-11-25 ENCOUNTER — Inpatient Hospital Stay
Admission: AD | Admit: 2021-11-25 | Discharge: 2021-11-26 | DRG: 918 | Disposition: A | Discharge status: Home / Self Care | Payer: BC Managed Care – PPO | Source: Intra-hospital | Attending: Psychiatry | Admitting: Psychiatry

## 2021-11-25 ENCOUNTER — Encounter: Payer: Self-pay | Admitting: Psychiatric/Mental Health

## 2021-11-25 DIAGNOSIS — T1491XA Suicide attempt, initial encounter: Secondary | ICD-10-CM

## 2021-11-25 DIAGNOSIS — F321 Major depressive disorder, single episode, moderate: Secondary | ICD-10-CM | POA: Diagnosis present

## 2021-11-25 DIAGNOSIS — T496X2A Poisoning by otorhinolaryngological drugs and preparations, intentional self-harm, initial encounter: Secondary | ICD-10-CM | POA: Diagnosis present

## 2021-11-25 DIAGNOSIS — F902 Attention-deficit hyperactivity disorder, combined type: Secondary | ICD-10-CM | POA: Diagnosis not present

## 2021-11-25 DIAGNOSIS — T450X2A Poisoning by antiallergic and antiemetic drugs, intentional self-harm, initial encounter: Secondary | ICD-10-CM | POA: Diagnosis present

## 2021-11-25 DIAGNOSIS — F411 Generalized anxiety disorder: Secondary | ICD-10-CM | POA: Diagnosis present

## 2021-11-25 DIAGNOSIS — Z813 Family history of other psychoactive substance abuse and dependence: Secondary | ICD-10-CM | POA: Diagnosis not present

## 2021-11-25 DIAGNOSIS — T450X2D Poisoning by antiallergic and antiemetic drugs, intentional self-harm, subsequent encounter: Secondary | ICD-10-CM | POA: Diagnosis not present

## 2021-11-25 DIAGNOSIS — F429 Obsessive-compulsive disorder, unspecified: Secondary | ICD-10-CM | POA: Diagnosis present

## 2021-11-25 DIAGNOSIS — F17210 Nicotine dependence, cigarettes, uncomplicated: Secondary | ICD-10-CM | POA: Diagnosis present

## 2021-11-25 DIAGNOSIS — T50902A Poisoning by unspecified drugs, medicaments and biological substances, intentional self-harm, initial encounter: Secondary | ICD-10-CM | POA: Diagnosis not present

## 2021-11-25 DIAGNOSIS — F324 Major depressive disorder, single episode, in partial remission: Secondary | ICD-10-CM

## 2021-11-25 DIAGNOSIS — F909 Attention-deficit hyperactivity disorder, unspecified type: Secondary | ICD-10-CM | POA: Diagnosis present

## 2021-11-25 DIAGNOSIS — F32A Depression, unspecified: Secondary | ICD-10-CM | POA: Diagnosis present

## 2021-11-25 DIAGNOSIS — F422 Mixed obsessional thoughts and acts: Secondary | ICD-10-CM

## 2021-11-25 DIAGNOSIS — Z818 Family history of other mental and behavioral disorders: Secondary | ICD-10-CM

## 2021-11-25 DIAGNOSIS — Z20822 Contact with and (suspected) exposure to covid-19: Secondary | ICD-10-CM | POA: Diagnosis present

## 2021-11-25 DIAGNOSIS — T450X1A Poisoning by antiallergic and antiemetic drugs, accidental (unintentional), initial encounter: Principal | ICD-10-CM

## 2021-11-25 MED ORDER — ALBUTEROL SULFATE HFA 108 (90 BASE) MCG/ACT IN AERS
2.0000 | INHALATION_SPRAY | Freq: Four times a day (QID) | RESPIRATORY_TRACT | Status: DC | PRN
  Filled 2021-11-25: qty 6.7

## 2021-11-25 MED ORDER — ALUM & MAG HYDROXIDE-SIMETH 200-200-20 MG/5ML PO SUSP
30.0000 mL | ORAL | Status: DC | PRN
Start: 2021-11-25 — End: 2021-11-26

## 2021-11-25 MED ORDER — TRAZODONE HCL 50 MG PO TABS
50.0000 mg | ORAL_TABLET | Freq: Every evening | ORAL | Status: DC | PRN
Start: 2021-11-25 — End: 2021-11-26

## 2021-11-25 MED ORDER — NICOTINE POLACRILEX 2 MG MT GUM
2.0000 mg | CHEWING_GUM | OROMUCOSAL | Status: DC | PRN
  Administered 2021-11-25 – 2021-11-26 (×2): 2 mg via ORAL
  Filled 2021-11-25 (×2): qty 1

## 2021-11-25 MED ORDER — HYDROXYZINE HCL 25 MG PO TABS
25.0000 mg | ORAL_TABLET | Freq: Three times a day (TID) | ORAL | Status: DC | PRN
  Administered 2021-11-25: 25 mg via ORAL
  Filled 2021-11-25: qty 1

## 2021-11-25 MED ORDER — ACETAMINOPHEN 325 MG PO TABS
650.0000 mg | ORAL_TABLET | Freq: Four times a day (QID) | ORAL | Status: DC | PRN
Start: 2021-11-25 — End: 2021-11-26

## 2021-11-25 MED ORDER — MAGNESIUM HYDROXIDE 400 MG/5ML PO SUSP
30.0000 mL | Freq: Every day | ORAL | Status: DC | PRN
Start: 2021-11-25 — End: 2021-11-26

## 2021-11-25 MED ORDER — ESCITALOPRAM OXALATE 10 MG PO TABS
10.0000 mg | ORAL_TABLET | Freq: Every day | ORAL | Status: DC
  Administered 2021-11-25 – 2021-11-26 (×2): 10 mg via ORAL
  Filled 2021-11-25 (×2): qty 1

## 2021-11-25 NOTE — Progress Notes (Signed)
21 years old female patient admitted after and intentional overdose. Patient anxious and tearful during admission assessment. Patient states " I am scared. I really didn't want to die." Patient states that her stresses are from job, arguments with boy friend and she couldn't meet her parents expectations. Patient denies SI,HI and AVH at this time. Skin assessment and body search done.No contraband found. Oriented to unit and made comfortable in room.Support and encouragement given. ?

## 2021-11-25 NOTE — BHH Suicide Risk Assessment (Signed)
[facility] Admission Suicide Risk Assessment ? ? ?Nursing information obtained from:  Patient ?Demographic factors:  Caucasian, Adolescent or young adult ?Current Mental Status:  NA ?Loss Factors:  Financial problems / change in socioeconomic status ?Historical Factors:  Impulsivity ?Risk Reduction Factors:  Living with another person, especially a relative, Employed ? ?Total Time spent with patient: 1 hour ?Principal Problem: Diphenhydramine overdose ?Diagnosis:  Principal Problem: ?  Diphenhydramine overdose ?Active Problems: ?  Attention deficit hyperactivity disorder (ADHD) ?  Depression ? ?Subjective Data: Patient seen and chart reviewed.  21 year old with a history of depression and anxiety came into the hospital after taking an intentional overdose at home.  Patient in interview with me today is now denying any suicidal ideation.  Saying that she does not think she really wanted to kill herself although she does not deny that she was having the thoughts at the time.  Minimizing symptoms.  Cooperative with treatment however and not showing any dangerous behavior so far on the unit ? ?Continued Clinical Symptoms:  ?Alcohol Use Disorder Identification Test Final Score (AUDIT): 1 ?The "Alcohol Use Disorders Identification Test", Guidelines for Use in Primary Care, Second Edition.  World Science writer [facility] ?Score between 0-7:  no or low risk or alcohol related problems. ?Score between 8-15:  moderate risk of alcohol related problems. ?Score between 16-19:  high risk of alcohol related problems. ?Score 20 or above:  warrants further diagnostic evaluation for alcohol dependence and treatment. ? ? ?CLINICAL FACTORS:  ? Severe Anxiety and/or Agitation ?Depression:   Impulsivity ? ? ?Musculoskeletal: ?Strength & Muscle Tone: within normal limits ?Gait & Station: normal ?Patient leans: N/A ? ?Psychiatric Specialty Exam: ? ?Presentation  ?General Appearance: Appropriate for Environment ? ?Eye  Contact:Fair ? ?Speech:Clear and Coherent ? ?Speech Volume:Increased ? ?Handedness:Right ? ? ?Mood and Affect  ?Mood:Anxious; Angry; Depressed; Irritable ? ?Affect:Depressed; Labile; Full Range ? ? ?Thought Process  ?Thought Processes:Coherent ? ?Descriptions of Associations:Intact ? ?Orientation:Full (Time, Place and Person) ? ?Thought Content:WDL ? ?History of Schizophrenia/Schizoaffective disorder:No data recorded ?Duration of Psychotic Symptoms:No data recorded ?Hallucinations:Hallucinations: None ? ?Ideas of Reference:None ? ?Suicidal Thoughts:Suicidal Thoughts: Yes, Active ?SI Active Intent and/or Plan: With Intent; With Plan; With Means to Carry Out ? ?Homicidal Thoughts:Homicidal Thoughts: No ? ? ?Sensorium  ?Memory:Immediate Fair; Remote Fair ? ?Judgment:Impaired ? ?Insight:Lacking ? ? ?Executive Functions  ?Concentration:Poor ? ?Attention Span:Poor ? ?Recall:Poor ? ?Fund of Knowledge:Poor ? ?Language:Poor ? ? ?Psychomotor Activity  ?Psychomotor Activity:Psychomotor Activity: Normal ? ? ?Assets  ?Assets:Communication Skills; Financial Resources/Insurance; Housing; Social Support ? ? ?Sleep  ?Sleep:Sleep: Poor ? ? ? ?Physical Exam: ?Physical Exam ?Vitals and nursing note reviewed.  ?Constitutional:   ?   Appearance: Normal appearance.  ?HENT:  ?   Head: Normocephalic and atraumatic.  ?   Mouth/Throat:  ?   Pharynx: Oropharynx is clear.  ?Eyes:  ?   Pupils: Pupils are equal, round, and reactive to light.  ?Cardiovascular:  ?   Rate and Rhythm: Normal rate and regular rhythm.  ?Pulmonary:  ?   Effort: Pulmonary effort is normal.  ?   Breath sounds: Normal breath sounds.  ?Abdominal:  ?   General: Abdomen is flat.  ?   Palpations: Abdomen is soft.  ?Musculoskeletal:     ?   General: Normal range of motion.  ?Skin: ?   General: Skin is warm and dry.  ?Neurological:  ?   General: No focal deficit present.  ?   Mental Status: She is alert. Mental status is  at baseline.  ?Psychiatric:     ?   Attention and  Perception: Attention normal.     ?   Mood and Affect: Mood is anxious.     ?   Speech: Speech normal.     ?   Behavior: Behavior is cooperative.     ?   Thought Content: Thought content normal. Thought content does not include suicidal ideation.     ?   Cognition and Memory: Cognition normal.     ?   Judgment: Judgment is impulsive.  ? ?Review of Systems  ?Constitutional: Negative.   ?HENT: Negative.    ?Eyes: Negative.   ?Respiratory: Negative.    ?Cardiovascular: Negative.   ?Gastrointestinal: Negative.   ?Musculoskeletal: Negative.   ?Skin: Negative.   ?Neurological: Negative.   ?Psychiatric/Behavioral:  Negative for depression, hallucinations, memory loss, substance abuse and suicidal ideas. The patient is nervous/anxious. The patient does not have insomnia.   ?Blood pressure 117/81, pulse 68, temperature 98.8 ?F (37.1 ?C), temperature source Oral, resp. rate 18, height 5\' 3"  (1.6 m), weight 59.9 kg, SpO2 100 %. Body mass index is 23.39 kg/m?. ? ? ?COGNITIVE FEATURES THAT CONTRIBUTE TO RISK:  ?Thought constriction (tunnel vision)   ? ?SUICIDE RISK:  ? Mild:  Suicidal ideation of limited frequency, intensity, duration, and specificity.  There are no identifiable plans, no associated intent, mild dysphoria and related symptoms, good self-control (both objective and subjective assessment), few other risk factors, and identifiable protective factors, including available and accessible social support. ? ?PLAN OF CARE: Continue 15-minute checks.  Continue prescribed medication.  Engage in individual and group therapy and full treatment team assessment.  Get collateral information if possible from family.  Contact outpatient provider. ? ?I certify that inpatient services furnished can reasonably be expected to improve the patient's condition.  ? ? , MD ?11/25/2021, 12:09 PM ? ?

## 2021-11-25 NOTE — Tx Team (Signed)
Initial Treatment Plan ?11/25/2021 ?11:06 AM ?[name] ?TDD:[id number] ? ? ? ?PATIENT STRESSORS: ?Financial difficulties   ?Marital or family conflict   ?Substance abuse   ? ? ?PATIENT STRENGTHS: ?Average or above average intelligence  ?Communication skills  ?Physical Health  ?Supportive family/friends  ?Work skills  ? ? ?PATIENT IDENTIFIED PROBLEMS: ?Job stress  ?Argument with boy friend.  ?Intentional overdose  ?  ?  ?  ?  ?  ?  ?  ? ?DISCHARGE CRITERIA:  ?Ability to meet basic life and health needs ?Adequate post-discharge living arrangements ?Medical problems require only outpatient monitoring ?Motivation to continue treatment in a less acute level of care ?Verbal commitment to aftercare and medication compliance ? ?PRELIMINARY DISCHARGE PLAN: ?Attend aftercare/continuing care group ?Return to previous living arrangement ? ?PATIENT/FAMILY INVOLVEMENT: ?This treatment plan has been presented to and reviewed with the patient, Nicole Branch, and/or family member,  The patient and family have been given the opportunity to ask questions and make suggestions. ? ?Leonarda Salon, RN ?11/25/2021, 11:06 AM ?

## 2021-11-25 NOTE — ED Notes (Signed)
Spoke with Danna Hefty, RN in [facility] who stated they were getting pt's room ready and she would call me back when they were ready ?

## 2021-11-25 NOTE — Progress Notes (Signed)
Pt calm and cooperative during assessment denying SI/HI/AVH. Pt presents anxious. Pt observed interacting appropriately with staff and peers on the unit. Pt didn't have any medications scheduled tonight and hasn't requested anything PRN. Pt given education, support, and encouragement to be active in her treatment plan. Pt being monitored Q 15 minutes for safety per unit protocol. Pt remains safe unit.  ?

## 2021-11-25 NOTE — H&P (Signed)
Psychiatric Admission Assessment Adult  Patient Identification: Nicole Branch MRN:  [id number] Date of Evaluation:  11/25/2021 Chief Complaint:  Depression [F32.A] Principal Diagnosis: Diphenhydramine overdose Diagnosis:  Principal Problem:   Diphenhydramine overdose Active Problems:   Attention deficit hyperactivity disorder (ADHD)   Depression  History of Present Illness: Patient seen and chart reviewed.  21 year old woman with a past history of anxiety and depression brought to the emergency room after intentionally overdosing on several things at home.  Patient says she was at her home around 5 or 6 PM and had had an argument with her boyfriend on the phone.  She was feeling overwhelmed and negative about herself feeling bad about how she thinks she does not live up to people's expectations.  She tells me that she overdosed on Benadryl and DayQuil.  She denied any other drugs although in the emergency room it was mentioned that she also had drunk Listerine.  Apparently she did not tell anyone but fell asleep and family then found her passed out and brought her to the hospital.  Patient had said that she was having suicidal intent when she came into the ER.  On interview with me today she repeats multiple times that she does not want to die.  She says that she thought it at the time but now really does not want to kill herself.  Patient has chronic anxiety and depression which she said she had been feeling like was under pretty good control recently.  She denied having had recent suicidal thoughts or feeling more depressed.  Sleeping adequately appetite good no new physical problems.  Patient says she occasionally will drink alcohol but was not drinking at the time.  Uses marijuana about 3 times a week.  She is seeing an outpatient psychiatrist through the [facility] here at the hospital and is on Lexapro 10 mg a day. Associated Signs/Symptoms: Depression Symptoms:  suicidal  attempt, anxiety, Duration of Depression Symptoms: Less than two weeks  (Hypo) Manic Symptoms:  Impulsivity, Anxiety Symptoms:  Excessive Worry, Psychotic Symptoms:   Denies any PTSD Symptoms: Chronic anxiety but no specific diagnosis of PTSD Total Time spent with patient: 1 hour  Past Psychiatric History: Patient has been seeing an outpatient psychiatrist for mental health treatment.  She is currently on Lexapro 10 mg a day.  She had been treated for ADHD in the past but had had poor tolerance of stimulants and is not currently taking anything for ADHD.  No previous hospitalization.  Denies any past suicide attempts.  Chart indicates that family said she had made suicidal statements in the past but had never acted on it previously.  Substance abuse does not appear to have ever been identified as an issue previously.  No history reported of any violence.  Patient increased her dose of Lexapro from 5 mg to 10 mg just within the last month and has been tolerating it well although she says she does have some night sweats.  Is the patient at risk to self? Yes.    Has the patient been a risk to self in the past 6 months? No.  Has the patient been a risk to self within the distant past? No.  Is the patient a risk to others? No.  Has the patient been a risk to others in the past 6 months? No.  Has the patient been a risk to others within the distant past? No.   Prior Inpatient Therapy:   Prior Outpatient Therapy:  Alcohol Screening: 1. How often do you have a drink containing alcohol?: Monthly or less 2. How many drinks containing alcohol do you have on a typical day when you are drinking?: 1 or 2 3. How often do you have six or more drinks on one occasion?: Never AUDIT-C Score: 1 4. How often during the last year have you found that you were not able to stop drinking once you had started?: Never 5. How often during the last year have you failed to do what was normally expected from you  because of drinking?: Never 6. How often during the last year have you needed a first drink in the morning to get yourself going after a heavy drinking session?: Never 7. How often during the last year have you had a feeling of guilt of remorse after drinking?: Never 8. How often during the last year have you been unable to remember what happened the night before because you had been drinking?: Never 9. Have you or someone else been injured as a result of your drinking?: No 10. Has a relative or friend or a doctor or another health worker been concerned about your drinking or suggested you cut down?: No Alcohol Use Disorder Identification Test Final Score (AUDIT): 1 Substance Abuse History in the last 12 months:  Yes.   Consequences of Substance Abuse: Seems a little uncertain but she could have been drinking alcohol at the time of the overdose as well Previous Psychotropic Medications: Yes  Psychological Evaluations: Yes  Past Medical History:  Past Medical History:  Diagnosis Date   ADHD (attention deficit hyperactivity disorder)    Allergies    Anxiety 2019   Asthma 2013   GERD (gastroesophageal reflux disease) 2015   IBS (irritable bowel syndrome) 2021   [facility]   History reviewed. No pertinent surgical history. Family History:  Family History  Adopted: Yes  Problem Relation Age of Onset   Drug abuse Mother    ADD / ADHD Sister    Family Psychiatric  History: None reported Tobacco Screening:   Social History:  Social History   Substance and Sexual Activity  Alcohol Use Never   Comment: she does drink      Social History   Substance and Sexual Activity  Drug Use Yes   Types: Marijuana   Comment: last week dont want mother to know.     Additional Social History:                           Allergies:  No Known Allergies Lab Results:  Results for orders placed or performed during the hospital encounter of 11/24/21 (from the past 48 hour(s))   Comprehensive metabolic panel     Status: None   Collection Time: 11/24/21  9:56 PM  Result Value Ref Range   Sodium 141 135 - 145 mmol/L   Potassium 3.7 3.5 - 5.1 mmol/L   Chloride 108 98 - 111 mmol/L   CO2 24 22 - 32 mmol/L   Glucose, Bld 87 70 - 99 mg/dL    Comment: Glucose reference range applies only to samples taken after fasting for at least 8 hours.   BUN 16 6 - 20 mg/dL   Creatinine, Ser 1.61 0.44 - 1.00 mg/dL   Calcium 9.3 8.9 - 09.6 mg/dL   Total Protein 7.7 6.5 - 8.1 g/dL   Albumin 4.7 3.5 - 5.0 g/dL   AST 31 15 - 41 U/L  ALT 20 0 - 44 U/L   Alkaline Phosphatase 65 38 - 126 U/L   Total Bilirubin 0.7 0.3 - 1.2 mg/dL   GFR, Estimated >16 >10 mL/min    Comment: (NOTE) Calculated using the CKD-EPI Creatinine Equation (2021)    Anion gap 9 5 - 15    Comment: Performed at [facility], [address] [place], [place] [address]  Ethanol     Status: None   Collection Time: 11/24/21  9:56 PM  Result Value Ref Range   Alcohol, Ethyl (B) <10 <10 mg/dL    Comment: (NOTE) Lowest detectable limit for serum alcohol is 10 mg/dL.  For medical purposes only. Performed at [facility], [address] [place], [place] [address]   Salicylate level     Status: Abnormal   Collection Time: 11/24/21  9:56 PM  Result Value Ref Range   Salicylate Lvl <7.0 (L) 7.0 - 30.0 mg/dL    Comment: Performed at [facility], [address] [place], [place] [address]  Acetaminophen level     Status: Abnormal   Collection Time: 11/24/21  9:56 PM  Result Value Ref Range   Acetaminophen (Tylenol), Serum <10 (L) 10 - 30 ug/mL    Comment: (NOTE) Therapeutic concentrations vary significantly. A range of 10-30 ug/mL  may be an effective concentration for many patients. However, some  are best treated at concentrations outside of this range. Acetaminophen concentrations >150 ug/mL at 4 hours after ingestion  and >50 ug/mL at 12 hours after ingestion are often  associated with  toxic reactions.  Performed at [facility], [address] [place], [place] [address]   cbc     Status: None   Collection Time: 11/24/21  9:56 PM  Result Value Ref Range   WBC 9.1 4.0 - 10.5 K/uL   RBC 4.97 3.87 - 5.11 MIL/uL   Hemoglobin 13.3 12.0 - 15.0 g/dL   HCT 21.3 08.6 - 57.8 %   MCV 82.5 80.0 - 100.0 fL   MCH 26.8 26.0 - 34.0 pg   MCHC 32.4 30.0 - 36.0 g/dL   RDW 46.9 62.9 - 52.8 %   Platelets 297 150 - 400 K/uL   nRBC 0.0 0.0 - 0.2 %    Comment: Performed at [facility], [address] [place], [place] [address]  Urine Drug Screen, Qualitative     Status: Abnormal   Collection Time: 11/24/21 10:15 PM  Result Value Ref Range   Tricyclic, Ur Screen POSITIVE (A) NONE DETECTED   Amphetamines, Ur Screen NONE DETECTED NONE DETECTED   MDMA (Ecstasy)Ur Screen NONE DETECTED NONE DETECTED   Cocaine Metabolite,Ur [place] NONE DETECTED NONE DETECTED   Opiate, Ur Screen NONE DETECTED NONE DETECTED   Phencyclidine (PCP) Ur S NONE DETECTED NONE DETECTED   Cannabinoid 50 Ng, Ur [place] POSITIVE (A) NONE DETECTED   Barbiturates, Ur Screen NONE DETECTED NONE DETECTED   Benzodiazepine, Ur Scrn NONE DETECTED NONE DETECTED   Methadone Scn, Ur NONE DETECTED NONE DETECTED    Comment: (NOTE) Tricyclics + metabolites, urine    Cutoff 1000 ng/mL Amphetamines + metabolites, urine  Cutoff 1000 ng/mL MDMA (Ecstasy), urine              Cutoff 500 ng/mL Cocaine Metabolite, urine          Cutoff 300 ng/mL Opiate + metabolites, urine        Cutoff 300 ng/mL Phencyclidine (PCP), urine  Cutoff 25 ng/mL Cannabinoid, urine                 Cutoff 50 ng/mL Barbiturates + metabolites, urine  Cutoff 200 ng/mL Benzodiazepine, urine              Cutoff 200 ng/mL Methadone, urine                   Cutoff 300 ng/mL  The urine drug screen provides only a preliminary, unconfirmed analytical test result and should not be used for non-medical purposes. Clinical  consideration and professional judgment should be applied to any positive drug screen result due to possible interfering substances. A more specific alternate chemical method must be used in order to obtain a confirmed analytical result. Gas chromatography / mass spectrometry (GC/MS) is the preferred confirm atory method. Performed at [facility], [address] [place], [place] [address]   Resp Panel by RT-PCR (Flu A&B, Covid) Urine, Clean Catch     Status: None   Collection Time: 11/24/21 10:15 PM   Specimen: Urine, Clean Catch; Nasopharyngeal(NP) swabs in vial transport medium  Result Value Ref Range   SARS Coronavirus 2 by RT PCR NEGATIVE NEGATIVE    Comment: (NOTE) SARS-CoV-2 target nucleic acids are NOT DETECTED.  The SARS-CoV-2 RNA is generally detectable in upper respiratory specimens during the acute phase of infection. The lowest concentration of SARS-CoV-2 viral copies this assay can detect is 138 copies/mL. A negative result does not preclude SARS-Cov-2 infection and should not be used as the sole basis for treatment or other patient management decisions. A negative result may occur with  improper specimen collection/handling, submission of specimen other than nasopharyngeal swab, presence of viral mutation(s) within the areas targeted by this assay, and inadequate number of viral copies(<138 copies/mL). A negative result must be combined with clinical observations, patient history, and epidemiological information. The expected result is Negative.  Fact Sheet for Patients:  BloggerCourse.com  Fact Sheet for Healthcare Providers:  SeriousBroker.it  This test is no t yet approved or cleared by the [place] FDA and  has been authorized for detection and/or diagnosis of SARS-CoV-2 by FDA under an Emergency Use Authorization (EUA). This EUA will remain  in effect (meaning this test can be used) for the  duration of the COVID-19 declaration under Section 564(b)(1) of the Act, 21 U.S.C.section 360bbb-3(b)(1), unless the authorization is terminated  or revoked sooner.       Influenza A by PCR NEGATIVE NEGATIVE   Influenza B by PCR NEGATIVE NEGATIVE    Comment: (NOTE) The Xpert Xpress SARS-CoV-2/FLU/RSV plus assay is intended as an aid in the diagnosis of influenza from Nasopharyngeal swab specimens and should not be used as a sole basis for treatment. Nasal washings and aspirates are unacceptable for Xpert Xpress SARS-CoV-2/FLU/RSV testing.  Fact Sheet for Patients: BloggerCourse.com  Fact Sheet for Healthcare Providers: SeriousBroker.it  This test is not yet approved or cleared by the [place] FDA and has been authorized for detection and/or diagnosis of SARS-CoV-2 by FDA under an Emergency Use Authorization (EUA). This EUA will remain in effect (meaning this test can be used) for the duration of the COVID-19 declaration under Section 564(b)(1) of the Act, 21 U.S.C. section 360bbb-3(b)(1), unless the authorization is terminated or revoked.  Performed at [facility], [address], [place], [place] [address]   POC urine preg, ED     Status: None   Collection Time: 11/24/21 10:17 PM  Result Value Ref  Range   Preg Test, Ur NEGATIVE NEGATIVE    Comment:        THE SENSITIVITY OF THIS METHODOLOGY IS >24 mIU/mL     Blood Alcohol level:  Lab Results  Component Value Date   ETH <10 11/24/2021    Metabolic Disorder Labs:  No results found for: HGBA1C, MPG No results found for: PROLACTIN No results found for: CHOL, TRIG, HDL, CHOLHDL, VLDL, LDLCALC  Current Medications: Current Facility-Administered Medications  Medication Dose Route Frequency Provider Last Rate Last Admin   acetaminophen (TYLENOL) tablet 650 mg  650 mg Oral Q6H PRN Dixon, Rashaun M, NP       albuterol (VENTOLIN HFA) 108 (90 Base) MCG/ACT  inhaler 2 puff  2 puff Inhalation Q6H PRN Jearld Lesch, NP       alum & mag hydroxide-simeth (MAALOX/MYLANTA) 200-200-20 MG/5ML suspension 30 mL  30 mL Oral Q4H PRN Durwin Nora, Rashaun M, NP       escitalopram (LEXAPRO) tablet 10 mg  10 mg Oral Daily Durwin Nora, Rashaun M, NP   10 mg at 11/25/21 1047   hydrOXYzine (ATARAX) tablet 25 mg  25 mg Oral TID PRN Jearld Lesch, NP   25 mg at 11/25/21 1047   magnesium hydroxide (MILK OF MAGNESIA) suspension 30 mL  30 mL Oral Daily PRN Jearld Lesch, NP       traZODone (DESYREL) tablet 50 mg  50 mg Oral QHS PRN Jearld Lesch, NP       PTA Medications: Medications Prior to Admission  Medication Sig Dispense Refill Last Dose   albuterol (PROVENTIL HFA;VENTOLIN HFA) 108 (90 Base) MCG/ACT inhaler Inhale 2 puffs into the lungs every 6 (six) hours as needed.      escitalopram (LEXAPRO) 10 MG tablet Take 1 tablet (10 mg total) by mouth daily. 90 tablet 1    medroxyPROGESTERone Acetate 150 MG/ML SUSY Inject 1 mL (150 mg total) into the muscle every 3 (three) months. 1 mL 3     Musculoskeletal: Strength & Muscle Tone: within normal limits Gait & Station: normal Patient leans: N/A            Psychiatric Specialty Exam:  Presentation  General Appearance: Appropriate for Environment  Eye Contact:Fair  Speech:Clear and Coherent  Speech Volume:Increased  Handedness:Right   Mood and Affect  Mood:Anxious; Angry; Depressed; Irritable  Affect:Depressed; Labile; Full Range   Thought Process  Thought Processes:Coherent  Duration of Psychotic Symptoms: No data recorded Past Diagnosis of Schizophrenia or Psychoactive disorder: No data recorded Descriptions of Associations:Intact  Orientation:Full (Time, Place and Person)  Thought Content:WDL  Hallucinations:Hallucinations: None  Ideas of Reference:None  Suicidal Thoughts:Suicidal Thoughts: Yes, Active SI Active Intent and/or Plan: With Intent; With Plan; With Means to Carry  Out  Homicidal Thoughts:Homicidal Thoughts: No   Sensorium  Memory:Immediate Fair; Remote Fair  Judgment:Impaired  Insight:Lacking   Executive Functions  Concentration:Poor  Attention Span:Poor  Recall:Poor  Fund of Knowledge:Poor  Language:Poor   Psychomotor Activity  Psychomotor Activity:Psychomotor Activity: Normal   Assets  Assets:Communication Skills; Financial Resources/Insurance; Housing; Social Support   Sleep  Sleep:Sleep: Poor    Physical Exam: Physical Exam Vitals and nursing note reviewed.  Constitutional:      Appearance: Normal appearance.  HENT:     Head: Normocephalic and atraumatic.     Mouth/Throat:     Pharynx: Oropharynx is clear.  Eyes:     Pupils: Pupils are equal, round, and reactive to light.  Cardiovascular:     Rate  and Rhythm: Normal rate and regular rhythm.  Pulmonary:     Effort: Pulmonary effort is normal.     Breath sounds: Normal breath sounds.  Abdominal:     General: Abdomen is flat.     Palpations: Abdomen is soft.  Musculoskeletal:        General: Normal range of motion.  Skin:    General: Skin is warm and dry.  Neurological:     General: No focal deficit present.     Mental Status: She is alert. Mental status is at baseline.  Psychiatric:        Attention and Perception: Attention normal.        Mood and Affect: Mood is anxious.        Speech: Speech normal.        Behavior: Behavior normal.        Thought Content: Thought content normal. Thought content does not include suicidal ideation.        Cognition and Memory: Cognition normal.        Judgment: Judgment is impulsive.   Review of Systems  Constitutional: Negative.   HENT: Negative.    Eyes: Negative.   Respiratory: Negative.    Cardiovascular: Negative.   Gastrointestinal: Negative.   Musculoskeletal: Negative.   Skin: Negative.   Neurological: Negative.   Psychiatric/Behavioral:  Negative for depression, hallucinations, memory loss,  substance abuse and suicidal ideas. The patient is nervous/anxious. The patient does not have insomnia.   Blood pressure 117/81, pulse 68, temperature 98.8 F (37.1 C), temperature source Oral, resp. rate 18, height 5\' 3"  (1.6 m), weight 59.9 kg, SpO2 100 %. Body mass index is 23.39 kg/m.  Treatment Plan Summary: Medication management and Plan continue escitalopram 10 mg a day.  Discussed with patient the option of increasing dose to 15 but she did not prefer to do that.  Continue 15-minute checks.  Engage in individual and group therapy.  Spoke with the mother and with the patient's consent.  Mother is actually requesting early discharge saying he thinks the patient would be better off at home.  We will reassess overnight for dangerousness and make a plan in the morning after communicating with her primary psychiatrist  Observation Level/Precautions:  15 minute checks  Laboratory:  UDS  Psychotherapy:    Medications:    Consultations:    Discharge Concerns:    Estimated LOS:  Other:     Physician Treatment Plan for Primary Diagnosis: Diphenhydramine overdose Long Term Goal(s): Improvement in symptoms so as ready for discharge  Short Term Goals: Ability to verbalize feelings will improve and Ability to disclose and discuss suicidal ideas  Physician Treatment Plan for Secondary Diagnosis: Principal Problem:   Diphenhydramine overdose Active Problems:   Attention deficit hyperactivity disorder (ADHD)   Depression  Long Term Goal(s): Improvement in symptoms so as ready for discharge  Short Term Goals: Compliance with prescribed medications will improve  I certify that inpatient services furnished can reasonably be expected to improve the patient's condition.    Mordecai Rasmussen, MD 5/7/202312:13 PM

## 2021-11-25 NOTE — ED Notes (Signed)
Patient has been accepted to [facility].  ?Patient assigned to room 307 ?Accepting physician is Lerry Liner, NP.  ?Call report to [phone].  ?Representative was [employer].  ? ?ER Staff is aware of it:  ?Financial risk analyst  ?Dr. Don Perking, ER MD  ?Darlen Round Patient's Nurse ?    ? Patient may be admitted after 7:30 a.m. ?

## 2021-11-25 NOTE — Consult Note (Signed)
[facility]   Reason for Consult:  Psychiatric Evaluation Referring Physician:  Dr. Vicente MalesBradler Patient Identification: Nicole Branch MRN:  [id number] Principal Diagnosis: Suicide attempt [facility](HCC) Diagnosis:  Principal Problem:   Suicide attempt [facility](HCC) Active Problems:   OCD (obsessive compulsive disorder)   Anxiety   Attention deficit hyperactivity disorder (ADHD)   Current moderate episode of major depressive disorder without prior episode (HCC)   Total Time spent with patient: 45 minutes  Subjective:   "I tried to kill myself"  HPI:  Nicole Branch, 21 y.o., female patient seen by this provider; chart reviewed and consulted with Dr. Vicente MalesBradler on 11/25/21.  On evaluation Nicole Branch reports that she was really "upset" today.  "I tried to kill myself, I took a lot of medicine".  She states the feeling of suicide came about after an argument with her boyfriend.  She reports having many additional stressors such as her finances and other stressors that she didn't want to go into detail about.  Patient denies prior SA but states that there has been several times where she has thought about it.    Today, her mother found her in the room "sleeping". She says her mother became alarmed by the empty bottles that laid  next to her.  Her mother then called the ambulance.  Patient endorses a hx of anxiety. She is currently being prescribed lexapro 10mg .  She says she has been on lexapro 10mg  for 3 months.  She says was taking prozac prior.  She says been on prozac for anxiety.  She has been on it since middle school.    During evaluation Nicole Branch is sitting on the bed talking to her mother and father.  She is alert/oriented x 4; angry/combative after being told that she would be admitted psychiatrically once medically cleared.  She began yelling, cursing and screaming that she is getting the "fuck out of here".  Her mother ask if she can be discharged.  This  provider responds that would not be possible due to the nature of the admission.  Her mood then becomes angrier and she starts demanding to be released ; and mood congruent with affect.  Her thought process is coherent and relevant; There is no indication that she is currently responding to internal/external stimuli or experiencing delusional thought content.  Patient denies suicidal/self-harm/homicidal ideation, psychosis, and paranoia at this time.  Patient remained angry and combative at the conclusion of assessment.    Recommendation:  Psychiatric inpatient hospitalization  Past Psychiatric History: OCD, ADHD, GAD, MDD  Risk to Self:   Risk to Others:   Prior Inpatient Therapy:   Prior Outpatient Therapy:    Past Medical History:  Past Medical History:  Diagnosis Date   ADHD (attention deficit hyperactivity disorder)    Allergies    Anxiety 2019   Asthma 2013   GERD (gastroesophageal reflux disease) 2015   IBS (irritable bowel syndrome) 2021   [facility]   History reviewed. No pertinent surgical history. Family History:  Family History  Adopted: Yes  Problem Relation Age of Onset   Drug abuse Mother    ADD / ADHD Sister    Family Psychiatric  History: unknown Social History:  Social History   Substance and Sexual Activity  Alcohol Use Never   Comment: she does drink      Social History   Substance and Sexual Activity  Drug Use Yes   Types: Marijuana   Comment: last week dont  want mother to know.     Social History   Socioeconomic History   Marital status: Single    Spouse name: Not on file   Number of children: Not on file   Years of education: Not on file   Highest education level: 9th grade  Occupational History   Not on file  Tobacco Use   Smoking status: Every Day    Packs/day: 0.25    Types: Cigarettes   Smokeless tobacco: Never  Vaping Use   Vaping Use: Never used  Substance and Sexual Activity   Alcohol use: Never    Comment: she does drink     Drug use: Yes    Types: Marijuana    Comment: last week dont want mother to know.    Sexual activity: Yes    Birth control/protection: Injection  Other Topics Concern   Not on file  Social History Narrative   Not on file   Social Determinants of Health   Financial Resource Strain: Not on file  Food Insecurity: Not on file  Transportation Needs: Not on file  Physical Activity: Not on file  Stress: Not on file  Social Connections: Not on file   Additional Social History:    Allergies:  No Known Allergies  Labs:  Results for orders placed or performed during the hospital encounter of 11/24/21 (from the past 48 hour(s))  Comprehensive metabolic panel     Status: None   Collection Time: 11/24/21  9:56 PM  Result Value Ref Range   Sodium 141 135 - 145 mmol/L   Potassium 3.7 3.5 - 5.1 mmol/L   Chloride 108 98 - 111 mmol/L   CO2 24 22 - 32 mmol/L   Glucose, Bld 87 70 - 99 mg/dL    Comment: Glucose reference range applies only to samples taken after fasting for at least 8 hours.   BUN 16 6 - 20 mg/dL   Creatinine, Ser 8.56 0.44 - 1.00 mg/dL   Calcium 9.3 8.9 - 31.4 mg/dL   Total Protein 7.7 6.5 - 8.1 g/dL   Albumin 4.7 3.5 - 5.0 g/dL   AST 31 15 - 41 U/L   ALT 20 0 - 44 U/L   Alkaline Phosphatase 65 38 - 126 U/L   Total Bilirubin 0.7 0.3 - 1.2 mg/dL   GFR, Estimated >97 >02 mL/min    Comment: (NOTE) Calculated using the CKD-EPI Creatinine Equation (2021)    Anion gap 9 5 - 15    Comment: Performed at [facility], [address] [place], [place] [address]  Ethanol     Status: None   Collection Time: 11/24/21  9:56 PM  Result Value Ref Range   Alcohol, Ethyl (B) <10 <10 mg/dL    Comment: (NOTE) Lowest detectable limit for serum alcohol is 10 mg/dL.  For medical purposes only. Performed at [facility], [address] [place], [place] [address]   Salicylate level     Status: Abnormal   Collection Time: 11/24/21  9:56 PM  Result Value Ref  Range   Salicylate Lvl <7.0 (L) 7.0 - 30.0 mg/dL    Comment: Performed at [facility], [address] [place], [place] [address]  Acetaminophen level     Status: Abnormal   Collection Time: 11/24/21  9:56 PM  Result Value Ref Range   Acetaminophen (Tylenol), Serum <10 (L) 10 - 30 ug/mL    Comment: (NOTE) Therapeutic concentrations vary significantly. A range of 10-30 ug/mL  may be  an effective concentration for many patients. However, some  are best treated at concentrations outside of this range. Acetaminophen concentrations >150 ug/mL at 4 hours after ingestion  and >50 ug/mL at 12 hours after ingestion are often associated with  toxic reactions.  Performed at [facility], [address] [place], [place] [address]   cbc     Status: None   Collection Time: 11/24/21  9:56 PM  Result Value Ref Range   WBC 9.1 4.0 - 10.5 K/uL   RBC 4.97 3.87 - 5.11 MIL/uL   Hemoglobin 13.3 12.0 - 15.0 g/dL   HCT 19.1 47.8 - 29.5 %   MCV 82.5 80.0 - 100.0 fL   MCH 26.8 26.0 - 34.0 pg   MCHC 32.4 30.0 - 36.0 g/dL   RDW 62.1 30.8 - 65.7 %   Platelets 297 150 - 400 K/uL   nRBC 0.0 0.0 - 0.2 %    Comment: Performed at [facility], [address] [place], [place] [address]  Urine Drug Screen, Qualitative     Status: Abnormal   Collection Time: 11/24/21 10:15 PM  Result Value Ref Range   Tricyclic, Ur Screen POSITIVE (A) NONE DETECTED   Amphetamines, Ur Screen NONE DETECTED NONE DETECTED   MDMA (Ecstasy)Ur Screen NONE DETECTED NONE DETECTED   Cocaine Metabolite,Ur [place] NONE DETECTED NONE DETECTED   Opiate, Ur Screen NONE DETECTED NONE DETECTED   Phencyclidine (PCP) Ur S NONE DETECTED NONE DETECTED   Cannabinoid 50 Ng, Ur [place] POSITIVE (A) NONE DETECTED   Barbiturates, Ur Screen NONE DETECTED NONE DETECTED   Benzodiazepine, Ur Scrn NONE DETECTED NONE DETECTED   Methadone Scn, Ur NONE DETECTED NONE DETECTED    Comment: (NOTE) Tricyclics + metabolites, urine    Cutoff  1000 ng/mL Amphetamines + metabolites, urine  Cutoff 1000 ng/mL MDMA (Ecstasy), urine              Cutoff 500 ng/mL Cocaine Metabolite, urine          Cutoff 300 ng/mL Opiate + metabolites, urine        Cutoff 300 ng/mL Phencyclidine (PCP), urine         Cutoff 25 ng/mL Cannabinoid, urine                 Cutoff 50 ng/mL Barbiturates + metabolites, urine  Cutoff 200 ng/mL Benzodiazepine, urine              Cutoff 200 ng/mL Methadone, urine                   Cutoff 300 ng/mL  The urine drug screen provides only a preliminary, unconfirmed analytical test result and should not be used for non-medical purposes. Clinical consideration and professional judgment should be applied to any positive drug screen result due to possible interfering substances. A more specific alternate chemical method must be used in order to obtain a confirmed analytical result. Gas chromatography / mass spectrometry (GC/MS) is the preferred confirm atory method. Performed at [facility], [address] [place], [place] [address]   Resp Panel by RT-PCR (Flu A&B, Covid) Urine, Clean Catch     Status: None   Collection Time: 11/24/21 10:15 PM   Specimen: Urine, Clean Catch; Nasopharyngeal(NP) swabs in vial transport medium  Result Value Ref Range   SARS Coronavirus 2 by RT PCR NEGATIVE NEGATIVE    Comment: (NOTE) SARS-CoV-2 target nucleic acids are NOT DETECTED.  The SARS-CoV-2 RNA is generally detectable in upper  respiratory specimens during the acute phase of infection. The lowest concentration of SARS-CoV-2 viral copies this assay can detect is 138 copies/mL. A negative result does not preclude SARS-Cov-2 infection and should not be used as the sole basis for treatment or other patient management decisions. A negative result may occur with  improper specimen collection/handling, submission of specimen other than nasopharyngeal swab, presence of viral mutation(s) within the areas targeted by this  assay, and inadequate number of viral copies(<138 copies/mL). A negative result must be combined with clinical observations, patient history, and epidemiological information. The expected result is Negative.  Fact Sheet for Patients:  BloggerCourse.com  Fact Sheet for Healthcare Providers:  SeriousBroker.it  This test is no t yet approved or cleared by the [place] FDA and  has been authorized for detection and/or diagnosis of SARS-CoV-2 by FDA under an Emergency Use Authorization (EUA). This EUA will remain  in effect (meaning this test can be used) for the duration of the COVID-19 declaration under Section 564(b)(1) of the Act, 21 U.S.C.section 360bbb-3(b)(1), unless the authorization is terminated  or revoked sooner.       Influenza A by PCR NEGATIVE NEGATIVE   Influenza B by PCR NEGATIVE NEGATIVE    Comment: (NOTE) The Xpert Xpress SARS-CoV-2/FLU/RSV plus assay is intended as an aid in the diagnosis of influenza from Nasopharyngeal swab specimens and should not be used as a sole basis for treatment. Nasal washings and aspirates are unacceptable for Xpert Xpress SARS-CoV-2/FLU/RSV testing.  Fact Sheet for Patients: BloggerCourse.com  Fact Sheet for Healthcare Providers: SeriousBroker.it  This test is not yet approved or cleared by the [place] FDA and has been authorized for detection and/or diagnosis of SARS-CoV-2 by FDA under an Emergency Use Authorization (EUA). This EUA will remain in effect (meaning this test can be used) for the duration of the COVID-19 declaration under Section 564(b)(1) of the Act, 21 U.S.C. section 360bbb-3(b)(1), unless the authorization is terminated or revoked.  Performed at [facility], [address] [place], [place] [address]   POC urine preg, ED     Status: None   Collection Time: 11/24/21 10:17 PM  Result Value  Ref Range   Preg Test, Ur NEGATIVE NEGATIVE    Comment:        THE SENSITIVITY OF THIS METHODOLOGY IS >24 mIU/mL     No current facility-administered medications for this encounter.   Current Outpatient Medications  Medication Sig Dispense Refill   albuterol (PROVENTIL HFA;VENTOLIN HFA) 108 (90 Base) MCG/ACT inhaler Inhale 2 puffs into the lungs every 6 (six) hours as needed.     escitalopram (LEXAPRO) 10 MG tablet Take 1 tablet (10 mg total) by mouth daily. 90 tablet 1   medroxyPROGESTERone Acetate 150 MG/ML SUSY Inject 1 mL (150 mg total) into the muscle every 3 (three) months. 1 mL 3    Musculoskeletal: Strength & Muscle Tone: within normal limits Gait & Station: normal Patient leans: N/A  Psychiatric Specialty Exam:  Presentation  General Appearance: Appropriate for Environment  Eye Contact:Fair  Speech:Clear and Coherent  Speech Volume:Increased  Handedness:Right   Mood and Affect  Mood:Anxious; Angry; Depressed; Irritable  Affect:Depressed; Labile; Full Range   Thought Process  Thought Processes:Coherent  Descriptions of Associations:Intact  Orientation:Full (Time, Place and Person)  Thought Content:WDL  History of Schizophrenia/Schizoaffective disorder:No data recorded Duration of Psychotic Symptoms:No data recorded Hallucinations:Hallucinations: None  Ideas of Reference:None  Suicidal Thoughts:Suicidal Thoughts: Yes, Active SI Active Intent and/or Plan: With Intent; With Plan; With  Means to Carry Out  Homicidal Thoughts:Homicidal Thoughts: No   Sensorium  Memory:Immediate Fair; Remote Fair  Judgment:Impaired  Insight:Lacking   Executive Functions  Concentration:Poor  Attention Span:Poor  Recall:Poor  Fund of Knowledge:Poor  Language:Poor   Psychomotor Activity  Psychomotor Activity:Psychomotor Activity: Normal   Assets  Assets:Communication Skills; Financial Resources/Insurance; Housing; Social Support   Sleep   Sleep:Sleep: Poor   Physical Exam: Physical Exam Vitals and nursing note reviewed.  HENT:     Head: Normocephalic and atraumatic.     Nose: Nose normal.     Mouth/Throat:     Mouth: Mucous membranes are dry.  Eyes:     Pupils: Pupils are equal, round, and reactive to light.  Pulmonary:     Effort: Pulmonary effort is normal.     Breath sounds: Normal breath sounds.  Abdominal:     General: Abdomen is flat.  Musculoskeletal:     Cervical back: Normal range of motion.  Skin:    General: Skin is warm and dry.  Neurological:     General: No focal deficit present.     Mental Status: She is alert and oriented to person, place, and time.  Psychiatric:        Attention and Perception: Attention normal.        Mood and Affect: Mood is anxious and depressed. Affect is angry.        Speech: Speech normal.        Behavior: Behavior is agitated, aggressive and combative.        Thought Content: Thought content includes suicidal ideation. Thought content includes suicidal plan.        Cognition and Memory: Cognition and memory normal.        Judgment: Judgment is impulsive and inappropriate.   Review of Systems  Psychiatric/Behavioral:  Positive for depression and suicidal ideas. Negative for hallucinations. The patient is nervous/anxious.   All other systems reviewed and are negative. Blood pressure 101/72, pulse 60, temperature 98.6 F (37 C), temperature source Oral, resp. rate 13, height 5\' 3"  (1.6 m), weight 59.9 kg, SpO2 100 %. Body mass index is 23.38 kg/m.  Treatment Plan Summary: Daily contact with patient to assess and evaluate symptoms and progress in treatment and Medication management  Disposition: Recommend psychiatric Inpatient admission when medically cleared. Supportive therapy provided about ongoing stressors. Discussed crisis plan, support from social network, calling 911, coming to the Emergency Department, and calling Suicide Hotline.  ,  NP 11/25/2021 2:07 AM

## 2021-11-25 NOTE — Group Note (Signed)
[facility] LCSW Group Therapy Note ? ? ?Group Date: 11/25/2021 ?Start Time: 1300 ?End Time: 1400 ? ? ?Type of Therapy/Topic:  Group Therapy:  Balance in Life ? ?Participation Level:  Active  ? ?Description of Group:   ? This group will address the concept of balance and how it feels and looks when one is unbalanced. Patients will be encouraged to process areas in their lives that are out of balance, and identify reasons for remaining unbalanced. Facilitators will guide patients utilizing problem- solving interventions to address and correct the stressor making their life unbalanced. Understanding and applying boundaries will be explored and addressed for obtaining  and maintaining a balanced life. Patients will be encouraged to explore ways to assertively make their unbalanced needs known to significant others in their lives, using other group members and facilitator for support and feedback. ? ?Therapeutic Goals: ?Patient will identify two or more emotions or situations they have that consume much of in their lives. ?Patient will identify signs/triggers that life has become out of balance:  ?Patient will identify two ways to set boundaries in order to achieve balance in their lives:  ?Patient will demonstrate ability to communicate their needs through discussion and/or role plays ? ?Summary of Patient Progress: Patient was present for the duration of group and actively participated in topic of discussion. Patient shared about how she hopes to improve on controlling herself when she feels irritable. Patient discussed the importance of finding balance with pushing oneself and allowing time to rest. Patient shared that if she pushes herself too hard, she becomes triggered by anxiety.  ? ? ? ?Therapeutic Modalities:   ?Cognitive Behavioral Therapy ?Solution-Focused Therapy ?Assertiveness Training ? ? ?Ileana Ladd Mariza [place], LCSWA ?

## 2021-11-25 NOTE — ED Notes (Signed)
Called down to [facility] who states they did not know they were getting a pt and that they would call me back ?

## 2021-11-25 NOTE — ED Notes (Signed)
Assumed care of patient.

## 2021-11-25 NOTE — ED Notes (Signed)
Attempted to call [facility] to give report, no answer ?

## 2021-11-26 DIAGNOSIS — T450X2D Poisoning by antiallergic and antiemetic drugs, intentional self-harm, subsequent encounter: Secondary | ICD-10-CM | POA: Diagnosis not present

## 2021-11-26 MED ORDER — NICOTINE POLACRILEX 2 MG MT GUM: 2.0000 mg | CHEWING_GUM | OROMUCOSAL | 0 refills | Status: DC | PRN

## 2021-11-26 MED ORDER — ESCITALOPRAM OXALATE 10 MG PO TABS: 10.0000 mg | ORAL_TABLET | Freq: Every day | ORAL | 1 refills | Status: DC

## 2021-11-26 NOTE — [facility] Counselor (Signed)
Adult Comprehensive Assessment ? ?Patient ID: Nicole Branch, female   DOB: 03-10-2001, 21 y.o.   MRN: [id number] ? ?Information Source: ?Information source: Patient ? ?Current Stressors:  ?Patient states their primary concerns and needs for treatment are:: Pt reports that she overdosed on benadryl and dayquil. She states that she did this in order to get high. ?Patient states their goals for this hospitilization and ongoing recovery are:: "Kinda be with everybody and hang around people who have the same feelings." ? ?Living/Environment/Situation:  ?Living Arrangements: Spouse/significant other ? ?Family History:  ?  ? ?Childhood History:  ?  ? ?Education:  ?  ? ?Employment/Work Situation:   ?  ? ?Financial Resources:   ?  ? ?Alcohol/Substance Abuse:   ?  ? ?Social Support System:   ?  ? ?Leisure/Recreation:   ?  ? ?Strengths/Needs:   ?  ? ?Discharge Plan:   ?  ? ?Summary/Recommendations:   ?Summary and Recommendations (to be completed by the evaluator): Patient is a 21 year old, single, female [place] [place] [facility] She reports that she is here because she took a lot of Benadryl and Dayquil. Per chart, pt was noted to have said she did this after having an argument with her boyfriend. Pt shared that she was just trying to get high due to feeling stressed and overwhelmed. Pt receives medication management through [facility] and endorsed interest in therapy moving forward. She has a primary diagnosis of Diphenhydramine overdose and depression. Recommendations include crisis stabilization, therapeutic milieu, encourage group attendance and participation, medication management for mood stabilization and development of comprehensive mental wellness plan. ? ?Glenis Smoker. 11/26/2021 ?

## 2021-11-26 NOTE — [facility] Suicide Risk Assessment (Signed)
[facility] INPATIENT:  Family/Significant Other Suicide Prevention Education ? ?Suicide Prevention Education:  ?Patient Refusal for Family/Significant Other Suicide Prevention Education: The patient Nicole Branch has refused to provide written consent for family/significant other to be provided Family/Significant Other Suicide Prevention Education during admission and/or prior to discharge.  Physician notified. ? ?SPE completed with pt, as pt refused to consent to family contact. SPI pamphlet provided to pt and pt was encouraged to share information with support network, ask questions, and talk about any concerns relating to SPE. Pt denies access to guns/firearms and verbalized understanding of information provided. Mobile Crisis information also provided to pt. ? ?Nicole Branch ?11/26/2021, 2:00 PM ?

## 2021-11-26 NOTE — Progress Notes (Signed)
Recreation Therapy Notes ? ?Date: 11/26/2021 ? ?Time: 10:40 am   ? ?Location: Craft room  ? ?Behavioral response: Appropriate ? ?Intervention Topic:  Problem Solving  ? ?Discussion/Intervention:  ?Group content on today was focused on problem solving. The group described what problem solving is. Patients expressed how problems affect them and how they deal with problems. Individuals identified healthy ways to deal with problems. Patients explained what normally happens to them when they do not deal with problems. The group expressed reoccurring problems for them. The group participated in the intervention ?Ways to Solve problems? where patients were given a chance to explore different ways to solve problems.  ?Clinical Observations/Feedback: ?Patient came to group and stated that she problems solves by finding triggers issues. She explained that she tries to problem solve by thinking about the outcomes of her actions and talking to people she trust. Individual was social with peers and staff while participating in the intervention.    ?Nicole Branch LRT/CTRS  ? ? ? ? ? ? ? ?[name] ?11/26/2021 12:18 PM ?

## 2021-11-26 NOTE — BH IP Treatment Plan (Signed)
Interdisciplinary Treatment and Diagnostic Plan Update ? ?11/26/2021 ?Time of Session: 9:30 AM ?[name] ?MRN: [id number] ? ?Principal Diagnosis: Diphenhydramine overdose ? ?Secondary Diagnoses: Principal Problem: ?  Diphenhydramine overdose ?Active Problems: ?  Attention deficit hyperactivity disorder (ADHD) ?  Depression ? ? ?Current Medications:  ?Current Facility-Administered Medications  ?Medication Dose Route Frequency Provider Last Rate Last Admin  ? acetaminophen (TYLENOL) tablet 650 mg  650 mg Oral Q6H PRN Dixon, Elray Buba, NP      ? albuterol (VENTOLIN HFA) 108 (90 Base) MCG/ACT inhaler 2 puff  2 puff Inhalation Q6H PRN Jearld Lesch, NP      ? alum & mag hydroxide-simeth (MAALOX/MYLANTA) 200-200-20 MG/5ML suspension 30 mL  30 mL Oral Q4H PRN Dixon, Rashaun M, NP      ? escitalopram (LEXAPRO) tablet 10 mg  10 mg Oral Daily Dixon, Rashaun M, NP   10 mg at 11/26/21 0845  ? hydrOXYzine (ATARAX) tablet 25 mg  25 mg Oral TID PRN Jearld Lesch, NP   25 mg at 11/25/21 1047  ? magnesium hydroxide (MILK OF MAGNESIA) suspension 30 mL  30 mL Oral Daily PRN Jearld Lesch, NP      ? nicotine polacrilex (NICORETTE) gum 2 mg  2 mg Oral PRN Clapacs, Jackquline Denmark, MD   2 mg at 11/26/21 1001  ? traZODone (DESYREL) tablet 50 mg  50 mg Oral QHS PRN Jearld Lesch, NP      ? ?PTA Medications: ?Medications Prior to Admission  ?Medication Sig Dispense Refill Last Dose  ? albuterol (PROVENTIL HFA;VENTOLIN HFA) 108 (90 Base) MCG/ACT inhaler Inhale 2 puffs into the lungs every 6 (six) hours as needed.     ? escitalopram (LEXAPRO) 10 MG tablet Take 1 tablet (10 mg total) by mouth daily. 90 tablet 1   ? medroxyPROGESTERone Acetate 150 MG/ML SUSY Inject 1 mL (150 mg total) into the muscle every 3 (three) months. 1 mL 3   ? ? ?Patient Stressors: Financial difficulties   ?Marital or family conflict   ?Substance abuse   ? ?Patient Strengths: Average or above average intelligence  ?Communication skills  ?Physical Health   ?Supportive family/friends  ?Work skills  ? ?Treatment Modalities: Medication Management, Group therapy, Case management,  ?1 to 1 session with clinician, Psychoeducation, Recreational therapy. ? ? ?Physician Treatment Plan for Primary Diagnosis: Diphenhydramine overdose ?Long Term Goal(s): Improvement in symptoms so as ready for discharge  ? ?Short Term Goals: Compliance with prescribed medications will improve ?Ability to verbalize feelings will improve ?Ability to disclose and discuss suicidal ideas ? ?Medication Management: Evaluate patient's response, side effects, and tolerance of medication regimen. ? ?Therapeutic Interventions: 1 to 1 sessions, Unit Group sessions and Medication administration. ? ?Evaluation of Outcomes: Adequate for Discharge ? ?Physician Treatment Plan for Secondary Diagnosis: Principal Problem: ?  Diphenhydramine overdose ?Active Problems: ?  Attention deficit hyperactivity disorder (ADHD) ?  Depression ? ?Long Term Goal(s): Improvement in symptoms so as ready for discharge  ? ?Short Term Goals: Compliance with prescribed medications will improve ?Ability to verbalize feelings will improve ?Ability to disclose and discuss suicidal ideas    ? ?Medication Management: Evaluate patient's response, side effects, and tolerance of medication regimen. ? ?Therapeutic Interventions: 1 to 1 sessions, Unit Group sessions and Medication administration. ? ?Evaluation of Outcomes: Adequate for Discharge ? ? ?RN Treatment Plan for Primary Diagnosis: Diphenhydramine overdose ?Long Term Goal(s): Knowledge of disease and therapeutic regimen to maintain health will improve ? ?Short Term Goals: Ability  to remain free from injury will improve, Ability to verbalize frustration and anger appropriately will improve, Ability to demonstrate self-control, Ability to participate in decision making will improve, Ability to verbalize feelings will improve, Ability to disclose and discuss suicidal ideas, Ability to  identify and develop effective coping behaviors will improve, and Compliance with prescribed medications will improve ? ?Medication Management: RN will administer medications as ordered by provider, will assess and evaluate patient's response and provide education to patient for prescribed medication. RN will report any adverse and/or side effects to prescribing provider. ? ?Therapeutic Interventions: 1 on 1 counseling sessions, Psychoeducation, Medication administration, Evaluate responses to treatment, Monitor vital signs and CBGs as ordered, Perform/monitor CIWA, COWS, AIMS and Fall Risk screenings as ordered, Perform wound care treatments as ordered. ? ?Evaluation of Outcomes: Adequate for Discharge ? ? ?LCSW Treatment Plan for Primary Diagnosis: Diphenhydramine overdose ?Long Term Goal(s): Safe transition to appropriate next level of care at discharge, Engage patient in therapeutic group addressing interpersonal concerns. ? ?Short Term Goals: Engage patient in aftercare planning with referrals and resources, Increase social support, Increase ability to appropriately verbalize feelings, Increase emotional regulation, Facilitate acceptance of mental health diagnosis and concerns, Facilitate patient progression through stages of change regarding substance use diagnoses and concerns, Identify triggers associated with mental health/substance abuse issues, and Increase skills for wellness and recovery ? ?Therapeutic Interventions: Assess for all discharge needs, 1 to 1 time with Child psychotherapist, Explore available resources and support systems, Assess for adequacy in community support network, Educate family and significant other(s) on suicide prevention, Complete Psychosocial Assessment, Interpersonal group therapy. ? ?Evaluation of Outcomes: Adequate for Discharge ? ? ?Progress in Treatment: ?Attending groups: Yes. ?Participating in groups: Yes. ?Taking medication as prescribed: Yes. ?Toleration medication:  Yes. ?Family/Significant other contact made: No, will contact:  when given permission. ?Patient understands diagnosis: Yes. ?Discussing patient identified problems/goals with staff: Yes. ?Medical problems stabilized or resolved: Yes. ?Denies suicidal/homicidal ideation: Yes. ?Issues/concerns per patient self-inventory: No. ?Other: none. ? ?New problem(s) identified: No, Describe:  none identified. ? ?New Short Term/Long Term Goal(s): medication management for mood stabilization; elimination of SI thoughts; development of comprehensive mental wellness/sobriety plan. ? ?Patient Goals: "[place] be with everybody and hang around people who have the same feelings." Pt expresses desire for therapy moving forward.   ? ?Discharge Plan or Barriers: Pt set to discharge today with continued outpatient treatment.  ? ?Reason for Continuation of Hospitalization: Anxiety ?Depression ?Medication stabilization ?Suicidal ideation ? ?Estimated Length of Stay: 1-3 days ? ?Last 3 [place] Suicide Severity Risk Score: ?Flowsheet Row Admission (Current) from 11/25/2021 in [facility] INPATIENT [facility]AL MEDICINE ED from 11/24/2021 in [facility] EMERGENCY DEPARTMENT  ?C-SSRS RISK CATEGORY Error: Q7 should not be populated when Q6 is No High Risk  ? ?  ? ? ?Last PHQ 2/9 Scores: ?   ? View : No data to display.  ?  ?  ?  ? ? ?Scribe for Treatment Team: ?Glenis Smoker, LCSW ?11/26/2021 ?10:36 AM ? ? ?

## 2021-11-26 NOTE — Progress Notes (Signed)
?  [facility] Adult Case Management Discharge Plan : ? ?Will you be returning to the same living situation after discharge:  Yes,  pt plans to return home. ?At discharge, do you have transportation home?: Yes,  pt to call and arrange transportation.  ?Do you have the ability to pay for your medications: Yes,  [employer]. ? ?Release of information consent forms completed and in the chart;  Patient's signature needed at discharge. ? ?Patient to Follow up at: ? Follow-up Information   ? ? [place]. Go to.   ?Specialty: Behavioral Health ?Why: You have an appointment scheduled on Wednesday, 11/28/21 at [place]. They ask that you show up at 1:45 to complete paperwork prior to appointment. Thanks! ?Contact information: ?[address] ?[place] ?[place] ?[phone] ? ?  ?  ? ? [place] Follow up on 12/03/2021.   ?Why: Please present for scheduled inperson appointment on 12/03/2021 at 1100AM. ?Contact information: ?[address] ?[place] [address] ?[phone] ? ? ?  ?  ? ?  ?  ? ?  ? ? ?Next level of care provider has access to [place] ? ?Safety Planning and Suicide Prevention discussed: Yes,  SPE completed with pt. ? ?Have you used any form of tobacco in the last 30 days? (Cigarettes, Smokeless Tobacco, Cigars, and/or Pipes): Yes ? ?Has patient been referred to the Quitline?: Patient refused referral ? ?Patient has been referred for addiction treatment: Pt. refused referral ? ?Shirl Harris, LCSW ?11/26/2021, 11:32 AM ?

## 2021-11-26 NOTE — Plan of Care (Signed)
?  Problem: Education: ?Goal: Knowledge of [facility] Education information/materials will improve ?11/26/2021 [id number] by Donette Larry, RN ?Outcome: Adequate for Discharge ?11/26/2021 0915 by Donette Larry, RN ?Outcome: Progressing ?Goal: Emotional status will improve ?11/26/2021 [id number] by Donette Larry, RN ?Outcome: Adequate for Discharge ?11/26/2021 0915 by Donette Larry, RN ?Outcome: Progressing ?Goal: Mental status will improve ?Outcome: Adequate for Discharge ?Goal: Verbalization of understanding the information provided will improve ?11/26/2021 [id number] by Donette Larry, RN ?Outcome: Adequate for Discharge ?11/26/2021 0915 by Donette Larry, RN ?Outcome: Progressing ?  ?Problem: Health Behavior/Discharge Planning: ?Goal: Identification of resources available to assist in meeting health care needs will improve ?Outcome: Adequate for Discharge ?Goal: Compliance with treatment plan for underlying cause of condition will improve ?11/26/2021 [id number] by Donette Larry, RN ?Outcome: Adequate for Discharge ?11/26/2021 0915 by Donette Larry, RN ?Outcome: Progressing ?  ?Problem: Physical Regulation: ?Goal: Ability to maintain clinical measurements within normal limits will improve ?11/26/2021 [id number] by Donette Larry, RN ?Outcome: Adequate for Discharge ?11/26/2021 0915 by Donette Larry, RN ?Outcome: Progressing ?  ?Problem: Education: ?Goal: Utilization of techniques to improve thought processes will improve ?Outcome: Adequate for Discharge ?Goal: Knowledge of the prescribed therapeutic regimen will improve ?11/26/2021 [id number] by Donette Larry, RN ?Outcome: Adequate for Discharge ?11/26/2021 0915 by Donette Larry, RN ?Outcome: Progressing ?  ?Problem: Activity: ?Goal: Interest or engagement in leisure activities will improve ?Outcome: Adequate for Discharge ?Goal: Imbalance in normal sleep/wake cycle will improve ?Outcome: Adequate for Discharge ?  ?Problem: Coping: ?Goal: Coping ability will  improve ?Outcome: Adequate for Discharge ?Goal: Will verbalize feelings ?Outcome: Adequate for Discharge ?  ?Problem: Role Relationship: ?Goal: Will demonstrate positive changes in social behaviors and relationships ?Outcome: Adequate for Discharge ?  ?Problem: Safety: ?Goal: Ability to disclose and discuss suicidal ideas will improve ?Outcome: Adequate for Discharge ?Goal: Ability to identify and utilize support systems that promote safety will improve ?Outcome: Adequate for Discharge ?  ?Problem: Self-Concept: ?Goal: Will verbalize positive feelings about self ?Outcome: Adequate for Discharge ?Goal: Level of anxiety will decrease ?11/26/2021 [id number] by Donette Larry, RN ?Outcome: Adequate for Discharge ?11/26/2021 0915 by Donette Larry, RN ?Outcome: Progressing ?  ?

## 2021-11-26 NOTE — [facility] Suicide Risk Assessment (Signed)
[facility] Discharge Suicide Risk Assessment ? ? ?Principal Problem: Diphenhydramine overdose ?Discharge Diagnoses: Principal Problem: ?  Diphenhydramine overdose ?Active Problems: ?  Attention deficit hyperactivity disorder (ADHD) ?  Depression ? ? ?Total Time spent with patient: 30 minutes ? ?Musculoskeletal: ?Strength & Muscle Tone: within normal limits ?Gait & Station: normal ?Patient leans: N/A ? ?Psychiatric Specialty Exam ? ?Presentation  ?General Appearance: Appropriate for Environment ? ?Eye Contact:Fair ? ?Speech:Clear and Coherent ? ?Speech Volume:Increased ? ?Handedness:Right ? ? ?Mood and Affect  ?Mood:Anxious; Angry; Depressed; Irritable ? ?Duration of Depression Symptoms: Less than two weeks ? ?Affect:Depressed; Labile; Full Range ? ? ?Thought Process  ?Thought Processes:Coherent ? ?Descriptions of Associations:Intact ? ?Orientation:Full (Time, Place and Person) ? ?Thought Content:WDL ? ?History of Schizophrenia/Schizoaffective disorder:No data recorded ?Duration of Psychotic Symptoms:No data recorded ?Hallucinations:Hallucinations: None ? ?Ideas of Reference:None ? ?Suicidal Thoughts:Suicidal Thoughts: Yes, Active ?SI Active Intent and/or Plan: With Intent; With Plan; With Means to Carry Out ? ?Homicidal Thoughts:Homicidal Thoughts: No ? ? ?Sensorium  ?Memory:Immediate Fair; Remote Fair ? ?Judgment:Impaired ? ?Insight:Lacking ? ? ?Executive Functions  ?Concentration:Poor ? ?Attention Span:Poor ? ?Recall:Poor ? ?Fund of Knowledge:Poor ? ?Language:Poor ? ? ?Psychomotor Activity  ?Psychomotor Activity:Psychomotor Activity: Normal ? ? ?Assets  ?Assets:Communication Skills; Financial Resources/Insurance; Housing; Social Support ? ? ?Sleep  ?Sleep:Sleep: Poor ? ? ?Physical Exam: ?Physical Exam ?Vitals and nursing note reviewed.  ?Constitutional:   ?   Appearance: Normal appearance.  ?HENT:  ?   Head: Normocephalic and atraumatic.  ?   Mouth/Throat:  ?   Pharynx: Oropharynx is clear.  ?Eyes:  ?   Pupils: Pupils  are equal, round, and reactive to light.  ?Cardiovascular:  ?   Rate and Rhythm: Normal rate and regular rhythm.  ?Pulmonary:  ?   Effort: Pulmonary effort is normal.  ?   Breath sounds: Normal breath sounds.  ?Abdominal:  ?   General: Abdomen is flat.  ?   Palpations: Abdomen is soft.  ?Musculoskeletal:     ?   General: Normal range of motion.  ?Skin: ?   General: Skin is warm and dry.  ?Neurological:  ?   General: No focal deficit present.  ?   Mental Status: She is alert. Mental status is at baseline.  ?Psychiatric:     ?   Attention and Perception: Attention normal.     ?   Mood and Affect: Mood normal.     ?   Speech: Speech normal.     ?   Behavior: Behavior is cooperative.     ?   Thought Content: Thought content normal.     ?   Cognition and Memory: Cognition normal.     ?   Judgment: Judgment normal.  ? ?Review of Systems  ?Constitutional: Negative.   ?HENT: Negative.    ?Eyes: Negative.   ?Respiratory: Negative.    ?Cardiovascular: Negative.   ?Gastrointestinal: Negative.   ?Musculoskeletal: Negative.   ?Skin: Negative.   ?Neurological: Negative.   ?Psychiatric/Behavioral: Negative.    ?Blood pressure 123/78, pulse 75, temperature 98.9 ?F (37.2 ?C), temperature source Oral, resp. rate 18, height 5\' 3"  (1.6 m), weight 59.9 kg, SpO2 100 %. Body mass index is 23.39 kg/m?. ? ?Mental Status Per Nursing Assessment::   ?On Admission:  NA ? ?Demographic Factors:  ?Adolescent or young adult ? ?Loss Factors: ?NA ? ?Historical Factors: ?Impulsivity ? ?Risk Reduction Factors:   ?Sense of responsibility to family, Living with another person, especially a relative, Positive social support, and Positive therapeutic  relationship ? ?Continued Clinical Symptoms:  ?Depression:   Impulsivity ? ?Cognitive Features That Contribute To Risk:  ?None   ? ?Suicide Risk:  ?Minimal: No identifiable suicidal ideation.  Patients presenting with no risk factors but with morbid ruminations; may be classified as minimal risk based on the  severity of the depressive symptoms ? ? ? ?Plan Of Care/Follow-up recommendations:  ?Patient is currently calm with an upbeat affect and denies suicidal ideation.  She is agreeable to continued medication management and is also stating that she is agreeable to follow up with therapy and will be given therapy referrals at discharge ? ?Mordecai Rasmussen, MD ?11/26/2021, 10:50 AM ?

## 2021-11-26 NOTE — Discharge Summary (Signed)
Physician Discharge Summary Note ? ?Patient:  Nicole Branch is an 21 y.o., female ?MRN:  [id number] ?DOB:  31-Aug-2000 ?Patient phone:  [phone] (home)  ?Patient address:   ?[address] ?[place] [address],  ?Total Time spent with patient: 30 minutes ? ?Date of Admission:  11/25/2021 ?Date of Discharge: 11/26/2021 ? ?Reason for Admission: Patient was admitted after being stabilized in the emergency room for overdose of Benadryl.  Reported suicidal ideation. ? ?Principal Problem: Diphenhydramine overdose ?Discharge Diagnoses: Principal Problem: ?  Diphenhydramine overdose ?Active Problems: ?  Attention deficit hyperactivity disorder (ADHD) ?  Depression ? ? ?Past Psychiatric History: Past history of depression and anxiety currently seeing outpatient psychiatrist.  No prior suicide attempts.  Past history of ADHD but not tolerating stimulant treatment. ? ?Past Medical History:  ?Past Medical History:  ?Diagnosis Date  ? ADHD (attention deficit hyperactivity disorder)   ? Allergies   ? Anxiety 2019  ? Asthma 2013  ? GERD (gastroesophageal reflux disease) 2015  ? IBS (irritable bowel syndrome) 2021  ? [facility]  ? History reviewed. No pertinent surgical history. ?Family History:  ?Family History  ?Adopted: Yes  ?Problem Relation Age of Onset  ? Drug abuse Mother   ? ADD / ADHD Sister   ? ?Family Psychiatric  History: See previous ?Social History:  ?Social History  ? ?Substance and Sexual Activity  ?Alcohol Use Never  ? Comment: she does drink   ?   ?Social History  ? ?Substance and Sexual Activity  ?Drug Use Yes  ? Types: Marijuana  ? Comment: last week dont want mother to know.   ?  ?Social History  ? ?Socioeconomic History  ? Marital status: Single  ?  Spouse name: Not on file  ? Number of children: Not on file  ? Years of education: Not on file  ? Highest education level: 9th grade  ?Occupational History  ? Not on file  ?Tobacco Use  ? Smoking status: Every Day  ?  Packs/day: 0.25  ?  Types:  Cigarettes  ? Smokeless tobacco: Never  ?Vaping Use  ? Vaping Use: Every day  ?Substance and Sexual Activity  ? Alcohol use: Never  ?  Comment: she does drink   ? Drug use: Yes  ?  Types: Marijuana  ?  Comment: last week dont want mother to know.   ? Sexual activity: Yes  ?  Birth control/protection: Injection  ?Other Topics Concern  ? Not on file  ?Social History Narrative  ? Not on file  ? ?Social Determinants of Health  ? ?Financial Resource Strain: Not on file  ?Food Insecurity: Not on file  ?Transportation Needs: Not on file  ?Physical Activity: Not on file  ?Stress: Not on file  ?Social Connections: Not on file  ? ? ?Hospital Course: Patient was admitted to the psychiatric ward.  15-minute checks were continued.  Patient denied any suicidal ideation and did not show any dangerous or suicidal behavior.  I spoke with the patient's mother with her consent and the mother was strongly requesting discharge.  Also spoke with the outpatient psychiatrist.  Patient will be discharged today on her same medication but with the stated agreement made in treatment team that she will be agreeable to now seeing a therapist.  She is given therapy referrals and has been given psychoeducation about the importance of following up on this. ? ?Physical Findings: ?AIMS:  , ,  ,  ,    ?CIWA:    ?COWS:    ? ?  Musculoskeletal: ?Strength & Muscle Tone: within normal limits ?Gait & Station: normal ?Patient leans: N/A ? ? ?Psychiatric Specialty Exam: ? ?Presentation  ?General Appearance: Appropriate for Environment ? ?Eye Contact:Fair ? ?Speech:Clear and Coherent ? ?Speech Volume:Increased ? ?Handedness:Right ? ? ?Mood and Affect  ?Mood:Anxious; Angry; Depressed; Irritable ? ?Affect:Depressed; Labile; Full Range ? ? ?Thought Process  ?Thought Processes:Coherent ? ?Descriptions of Associations:Intact ? ?Orientation:Full (Time, Place and Person) ? ?Thought Content:WDL ? ?History of Schizophrenia/Schizoaffective disorder:No data  recorded ?Duration of Psychotic Symptoms:No data recorded ?Hallucinations:Hallucinations: None ? ?Ideas of Reference:None ? ?Suicidal Thoughts:Suicidal Thoughts: Yes, Active ?SI Active Intent and/or Plan: With Intent; With Plan; With Means to Carry Out ? ?Homicidal Thoughts:Homicidal Thoughts: No ? ? ?Sensorium  ?Memory:Immediate Fair; Remote Fair ? ?Judgment:Impaired ? ?Insight:Lacking ? ? ?Executive Functions  ?Concentration:Poor ? ?Attention Span:Poor ? ?Recall:Poor ? ?Fund of Knowledge:Poor ? ?Language:Poor ? ? ?Psychomotor Activity  ?Psychomotor Activity:Psychomotor Activity: Normal ? ? ?Assets  ?Assets:Communication Skills; Financial Resources/Insurance; Housing; Social Support ? ? ?Sleep  ?Sleep:Sleep: Poor ? ? ? ?Physical Exam: ?Physical Exam ?Vitals and nursing note reviewed.  ?Constitutional:   ?   Appearance: Normal appearance.  ?HENT:  ?   Head: Normocephalic and atraumatic.  ?   Mouth/Throat:  ?   Pharynx: Oropharynx is clear.  ?Eyes:  ?   Pupils: Pupils are equal, round, and reactive to light.  ?Cardiovascular:  ?   Rate and Rhythm: Normal rate and regular rhythm.  ?Pulmonary:  ?   Effort: Pulmonary effort is normal.  ?   Breath sounds: Normal breath sounds.  ?Abdominal:  ?   General: Abdomen is flat.  ?   Palpations: Abdomen is soft.  ?Musculoskeletal:     ?   General: Normal range of motion.  ?Skin: ?   General: Skin is warm and dry.  ?Neurological:  ?   General: No focal deficit present.  ?   Mental Status: She is alert. Mental status is at baseline.  ?Psychiatric:     ?   Attention and Perception: Attention normal.     ?   Mood and Affect: Mood normal.     ?   Speech: Speech normal.     ?   Behavior: Behavior normal.     ?   Thought Content: Thought content normal.     ?   Cognition and Memory: Cognition normal.     ?   Judgment: Judgment normal.  ? ?Review of Systems  ?Constitutional: Negative.   ?HENT: Negative.    ?Eyes: Negative.   ?Respiratory: Negative.    ?Cardiovascular: Negative.    ?Gastrointestinal: Negative.   ?Musculoskeletal: Negative.   ?Skin: Negative.   ?Neurological: Negative.   ?Psychiatric/Behavioral: Negative.    ?Blood pressure 123/78, pulse 75, temperature 98.9 ?F (37.2 ?C), temperature source Oral, resp. rate 18, height 5\' 3"  (1.6 m), weight 59.9 kg, SpO2 100 %. Body mass index is 23.39 kg/m?. ? ? ?Social History  ? ?Tobacco Use  ?Smoking Status Every Day  ? Packs/day: 0.25  ? Types: Cigarettes  ?Smokeless Tobacco Never  ? ?Tobacco Cessation:  A prescription for an FDA-approved tobacco cessation medication provided at discharge ? ? ?Blood Alcohol level:  ?Lab Results  ?Component Value Date  ? ETH <10 11/24/2021  ? ? ?Metabolic Disorder Labs:  ?No results found for: HGBA1C, MPG ?No results found for: PROLACTIN ?No results found for: CHOL, TRIG, HDL, CHOLHDL, VLDL, LDLCALC ? ?See Psychiatric Specialty Exam and Suicide Risk Assessment completed by Attending Physician prior  to discharge. ? ?Discharge destination:  Home ? ?Is patient on multiple antipsychotic therapies at discharge:  No   ?Has Patient had three or more failed trials of antipsychotic monotherapy by history:  No ? ?Recommended Plan for Multiple Antipsychotic Therapies: ?NA ? ?Discharge Instructions   ? ? Diet - low sodium heart healthy   Complete by: As directed ?  ? Increase activity slowly   Complete by: As directed ?  ? ?  ? ?Allergies as of 11/26/2021   ?No Known Allergies ?  ? ?  ?Medication List  ?  ? ?TAKE these medications   ? ?  Indication  ?albuterol 108 (90 Base) MCG/ACT inhaler ?Commonly known as: VENTOLIN HFA ?Inhale 2 puffs into the lungs every 6 (six) hours as needed. ? Indication: Asthma ?  ?escitalopram 10 MG tablet ?Commonly known as: LEXAPRO ?Take 1 tablet (10 mg total) by mouth daily. ? Indication: Generalized Anxiety Disorder, Major Depressive Disorder ?  ?medroxyPROGESTERone Acetate 150 MG/ML Susy ?Inject 1 mL (150 mg total) into the muscle every 3 (three) months. ? Indication: Birth Control  Treatment ?  ?nicotine polacrilex 2 MG gum ?Commonly known as: NICORETTE ?Take 1 each (2 mg total) by mouth as needed for smoking cessation. ? Indication: Nicotine Addiction ?  ? ?  ? ? ? ?Follow-up recommendations: Follow-up with usual p

## 2021-11-26 NOTE — Progress Notes (Signed)
Patient ID: Nicole Branch, female   DOB: [birth year], 21 y.o.   MRN: [id number] ?Patient denies SI/HI/AVH. Belongings were returned to patient. Discharge instructions  including medication and follow up information were reviewed with patient and understanding was verbalized. Patient was not observed to be in distress at time of discharge. Patient was escorted out with staff to medical mall to be transported home by family member. ?

## 2021-11-26 NOTE — Plan of Care (Signed)
?  Problem: Education: ?Goal: Knowledge of [facility] Education information/materials will improve ?Outcome: Progressing ?Goal: Emotional status will improve ?Outcome: Progressing ?Goal: Verbalization of understanding the information provided will improve ?Outcome: Progressing ?  ?Problem: Health Behavior/Discharge Planning: ?Goal: Compliance with treatment plan for underlying cause of condition will improve ?Outcome: Progressing ?  ?Problem: Physical Regulation: ?Goal: Ability to maintain clinical measurements within normal limits will improve ?Outcome: Progressing ?  ?Problem: Education: ?Goal: Knowledge of the prescribed therapeutic regimen will improve ?Outcome: Progressing ?  ?Problem: Self-Concept: ?Goal: Level of anxiety will decrease ?Outcome: Progressing ?  ?

## 2021-11-28 ENCOUNTER — Encounter: Payer: Self-pay | Admitting: Child and Adolescent Psychiatry

## 2021-11-28 ENCOUNTER — Ambulatory Visit (INDEPENDENT_AMBULATORY_CARE_PROVIDER_SITE_OTHER): Payer: BC Managed Care – PPO | Admitting: Child and Adolescent Psychiatry

## 2021-11-28 DIAGNOSIS — F411 Generalized anxiety disorder: Secondary | ICD-10-CM

## 2021-11-28 DIAGNOSIS — F324 Major depressive disorder, single episode, in partial remission: Secondary | ICD-10-CM | POA: Diagnosis not present

## 2021-11-28 MED ORDER — ESCITALOPRAM OXALATE 10 MG PO TABS: 15.0000 mg | ORAL_TABLET | Freq: Every day | ORAL | 1 refills | Status: DC

## 2021-11-28 MED ORDER — HYDROXYZINE HCL 25 MG PO TABS: ORAL_TABLET | ORAL | 0 refills | Status: DC

## 2021-11-28 NOTE — Progress Notes (Signed)
? ? ?BH MD/PA/NP OP Progress Note ? ?11/28/2021 4:30 PM ?Nicole Branch  ?MRN:  [id number]  ? ?Chief Complaint:  ?Chief Complaint   ?Follow-up ?  ?Medication management follow-up for mood and anxiety. ? ?HPI: This is a 21 year old Caucasian female with psychiatric history significant of generalized anxiety disorder, ADHD was seen and evaluated in person for medication management follow-up.  She was evaluated separately from her mother and I spoke with her mother with Nicole Branch's verbal informed consent to obtain collateral information and discuss her treatment plan. ? ?In the interim since the last appointment, she was admitted to [facility] psychiatric unit for brief duration.  Patient apparently overdosed on DayQuil and Benadryl, found by mother and subsequently was brought to the emergency room.  Patient was subsequently admitted.  Inpatient psychiatrist reached out to me, informing patient and mother is requesting discharge.  Patient was subsequently discharged on second day of her hospitalization. ? ?Today, [name] corroborated the hx that lead to her hospitalization. She reports that she became very stressed after an argument with her boyfriend and learning that other people around her are graduating and getting better jobs etc. She reports that to avoid this feeling she overdosed on medications. She reports that she did not have intention to die and she loves herself. She reports that prior to OD, she has been doing well, her anxiety has been less, her mood has been better and not irritable, she was sleeping well and eating well.  ? ?She reports that hospitalization was traumatic for her, she has been having nightmares, waking up frequently at night, has been more anxious since the hospitalization.  She denies any suicidal thoughts or homicidal thoughts and is happy that she is at home and not in the hospital. Provided refelctive and empathic listening, and validated patient's experience. Discussed that with time  this will improve.   ? ?Mother corroborates pt's reports and that she did not see any warning signs leading up to pt's overdose. Mother reports that pt is doing well since the discharge but more anxious due to her hospitalization experience.  ? ?We discussed to try hydroxyzine as needed for anxiety and sleeping difficulties.  We also discussed to increase the dose of Lexapro to 15 mg once a day while strongly recommended to start individual therapy.  Mother was provided the list of resources to contact and make appointments.  ? ?Visit Diagnosis:  ?  ICD-10-CM   ?1. Generalized anxiety disorder  F41.1 escitalopram (LEXAPRO) 10 MG tablet  ?  DISCONTINUED: escitalopram (LEXAPRO) 10 MG tablet  ?  ?2. Major depressive disorder with single episode, in partial remission (HCC)  F32.4 escitalopram (LEXAPRO) 10 MG tablet  ?  DISCONTINUED: escitalopram (LEXAPRO) 10 MG tablet  ?  ? ? ? ? ?Past Psychiatric History: reviewed today from last visit and no change.  ?Past Medical History:  ?Past Medical History:  ?Diagnosis Date  ? ADHD (attention deficit hyperactivity disorder)   ? Allergies   ? Anxiety 2019  ? Asthma 2013  ? GERD (gastroesophageal reflux disease) 2015  ? IBS (irritable bowel syndrome) 2021  ? [facility]  ? History reviewed. No pertinent surgical history. ? ?Family Psychiatric History: As mentioned in initial H&P, reviewed today, no change  ? ?Family History:  ?Family History  ?Adopted: Yes  ?Problem Relation Age of Onset  ? Drug abuse Mother   ? ADD / ADHD Sister   ? ? ?Social History:  ?Social History  ? ?Socioeconomic History  ? Marital  status: Single  ?  Spouse name: Not on file  ? Number of children: Not on file  ? Years of education: Not on file  ? Highest education level: Not on file  ?Occupational History  ? Not on file  ?Tobacco Use  ? Smoking status: Every Day  ?  Packs/day: 0.25  ?  Types: Cigarettes  ? Smokeless tobacco: Never  ?Vaping Use  ? Vaping Use: Every day  ? Substances: THC  ?Substance and  Sexual Activity  ? Alcohol use: Not Currently  ?  Comment: she does drink maybe a couple in a 2 week  ? Drug use: Yes  ?  Types: Marijuana  ?  Comment: last week dont want mother to know.   ? Sexual activity: Yes  ?  Birth control/protection: Injection  ?Other Topics Concern  ? Not on file  ?Social History Narrative  ? Not on file  ? ?Social Determinants of Health  ? ?Financial Resource Strain: Not on file  ?Food Insecurity: Not on file  ?Transportation Needs: Not on file  ?Physical Activity: Not on file  ?Stress: Not on file  ?Social Connections: Not on file  ? ? ?Allergies: No Known Allergies ? ?Metabolic Disorder Labs: ?No results found for: HGBA1C, MPG ?No results found for: PROLACTIN ?No results found for: CHOL, TRIG, HDL, CHOLHDL, VLDL, LDLCALC ?No results found for: TSH ? ?Therapeutic Level Labs: ?No results found for: LITHIUM ?No results found for: VALPROATE ?No components found for:  CBMZ ? ?Current Medications: ?Current Outpatient Medications  ?Medication Sig Dispense Refill  ? albuterol (PROVENTIL HFA;VENTOLIN HFA) 108 (90 Base) MCG/ACT inhaler Inhale 2 puffs into the lungs every 6 (six) hours as needed.    ? hydrOXYzine (ATARAX) 25 MG tablet Take 0.5-1 tablets(12.5-25 mg total) by mouth 3(three) times daily as needed for anxiety, and 1 tablet(25 mg total) at bedtime as needed for sleeping difficulties. 30 tablet 0  ? medroxyPROGESTERone Acetate 150 MG/ML SUSY Inject 1 mL (150 mg total) into the muscle every 3 (three) months. 1 mL 3  ? nicotine polacrilex (NICORETTE) 2 MG gum Take 1 each (2 mg total) by mouth as needed for smoking cessation. 100 tablet 0  ? escitalopram (LEXAPRO) 10 MG tablet Take 1.5 tablets (15 mg total) by mouth daily. 45 tablet 1  ? ?No current facility-administered medications for this visit.  ? ? ? ?Musculoskeletal: ?Strength & Muscle Tone: WNL ?Gait & Station: Normal ?Patient leans: N/A ? ?Psychiatric Specialty Exam: ?ROSReview of 12 systems negative except as mentioned in HPI   ?Blood pressure 113/79, pulse (!) 132, temperature 98.3 ?F (36.8 ?C), temperature source Oral, weight 122 lb 9.6 oz (55.6 kg).Body mass index is 21.72 kg/m?.  ?General Appearance: Casual  ?Eye Contact:  Good  ?Speech:  Clear and Coherent and Normal Rate  ?Volume:  Normal  ?Mood:  "ok" ?  ?Affect:  Appropriate, Congruent, and Restricted  ?Thought Process:  Goal Directed and Linear  ?Orientation:  Full (Time, Place, and Person)  ?Thought Content: Logical   ?Suicidal Thoughts:  No  ?Homicidal Thoughts:  No  ?Memory:  Immediate;   Good ?Recent;   Good ?Remote;   Good  ?Judgement:  Good  ?Insight:  Shallow  ?Psychomotor Activity:  Normal  ?Concentration:  Concentration: Good and Attention Span: Good  ?Recall:  Good  ?Fund of Knowledge: Good  ?Language: Good  ?Akathisia:  No  ?  ?AIMS (if indicated): not done  ?Assets:  Communication Skills ?Desire for Improvement ?Financial Resources/Insurance ?Housing ?Leisure  Time ?Physical Health ?Social Support ?Transportation ?Vocational/Educational  ?ADL's:  Intact  ?Cognition: WNL  ?Sleep:   ?Good  ?  ? ?Screenings: ?AUDIT   ? ?Flowsheet Row Admission (Discharged) from 11/25/2021 in [facility]PATIENT BEH[facility]AL MEDICINE  ?Alcohol Use Disorder Identification Test Final Score (AUDIT) 1  ? ?  ? ?Flowsheet Row Admission (Discharged) from 11/25/2021 in [facility] INPATIENT BEHAVIORAL MEDICINE ED from 11/24/2021 in [facility] EMERGENCY DEPARTMENT  ?C-SSRS RISK CATEGORY Error: Q7 should not be populated when Q6 is No High Risk  ? ?  ? ? ? ?Assessment and Plan:  ? ?This is an 21 year old Caucasian female, currently domiciled with her adoptive parents and twin sister, has psychiatric history significant of disruptive behavior disorder, anxiety and ADHD diagnosed at the age of 7 was not in any psychiatric treatment until 02/2018 referred by her PCP for psychiatric evaluation and med management in 02/2018.  ? ?Trialed Concerta - stopped due to loss of appetite and it not being  effective, did not want to try anything else. Zoloft titrated up to 75 mg daily but self tapered due to feeling tired but was effective for anxiety. She was started on Prozac and has responded well but self discont

## 2021-11-30 ENCOUNTER — Telehealth: Payer: Self-pay

## 2021-11-30 DIAGNOSIS — F324 Major depressive disorder, single episode, in partial remission: Secondary | ICD-10-CM

## 2021-11-30 DIAGNOSIS — F411 Generalized anxiety disorder: Secondary | ICD-10-CM

## 2021-11-30 MED ORDER — ESCITALOPRAM OXALATE 10 MG PO TABS: 10.0000 mg | ORAL_TABLET | Freq: Every day | ORAL | 1 refills | Status: DC

## 2021-11-30 MED ORDER — ESCITALOPRAM OXALATE 5 MG PO TABS: 5.0000 mg | ORAL_TABLET | Freq: Every day | ORAL | 1 refills | Status: DC

## 2021-11-30 NOTE — Telephone Encounter (Signed)
Rx sent 

## 2021-11-30 NOTE — Telephone Encounter (Signed)
pt called states that the medication you put her on yesterday the pharmacy states you need to write one for 10mg  and one for 5mg   ?

## 2021-12-07 ENCOUNTER — Telehealth: Payer: Self-pay | Admitting: Child and Adolescent Psychiatry

## 2021-12-07 NOTE — Telephone Encounter (Signed)
Received fax requesting refill on lexapro, pt already filled rx on 05/15 for 30 days, will refrain from sending rx at this time.

## 2021-12-18 ENCOUNTER — Telehealth (INDEPENDENT_AMBULATORY_CARE_PROVIDER_SITE_OTHER): Payer: BC Managed Care – PPO | Admitting: Child and Adolescent Psychiatry

## 2021-12-18 DIAGNOSIS — F411 Generalized anxiety disorder: Secondary | ICD-10-CM | POA: Diagnosis not present

## 2021-12-18 DIAGNOSIS — F320 Major depressive disorder, single episode, mild: Secondary | ICD-10-CM

## 2021-12-18 NOTE — Progress Notes (Signed)
Virtual Visit via Video Note  I connected with Nicole Branch on 12/18/21 at  8:00 AM EDT by a video enabled telemedicine application and verified that I am speaking with the correct person using two identifiers.  Location: Patient: home Provider: office   I discussed the limitations of evaluation and management by telemedicine and the availability of in person appointments. The patient expressed understanding and agreed to proceed.    I discussed the assessment and treatment plan with the patient. The patient was provided an opportunity to ask questions and all were answered. The patient agreed with the plan and demonstrated an understanding of the instructions.   The patient was advised to call back or seek an in-person evaluation if the symptoms worsen or if the condition fails to improve as anticipated.  I provided 20 minutes of non-face-to-face time during this encounter.   [name], MD   [facility] MD/PA/NP OP Progress Note  12/18/2021 10:58 AM Nicole Branch  MRN:  [id number]   Chief Complaint:   Medication management follow-up for mood and anxiety.  HPI: This is a 21 year old Caucasian female with psychiatric history significant of generalized anxiety disorder, ADHD was seen and evaluated in person for medication management follow-up.  She was evaluated separately from her mother and I spoke with her mother with Mikaylee's verbal informed consent to obtain collateral information and discuss her treatment plan.  She reports that recently she has been feeling tired a lot.  She reports that it started more since she has increased the dose of Lexapro to 15 mg.  She is taking medication in the morning.  We discussed that it could be because of Lexapro and recommended her to switch to bedtime and if she continues to feel tired a lot during the day then give call back in next 2 weeks and we will consider adjusting the medication.  She reports that her mood is tired but not  depressed.  She reports that she is more anxious lately, has been vaping nicotine and marijuana in significant quantity lately.  She denies any SI or HI.  She reports that she is sleeping 9 hours at night, and still takes a nap after she comes back from work.  She denies any other psychosocial stressors.  I discussed with her that her tiredness could be in the context of her increased marijuana and nicotine use and provided psychoeducation on decreasing the use.  She reports that she started seeing therapist at [facility] and had 2 sessions of her, it is going well for her and she likes her new therapist.  She has been seeing her every week.  Her mother denies concerns for today's appointment, and reports that overall she seems to be doing "okay".  They will follow back again in 6 weeks or earlier if needed.  Visit Diagnosis:    ICD-10-CM   1. Generalized anxiety disorder  F41.1     2. Current mild episode of major depressive disorder without prior episode (HCC)  F32.0          Past Psychiatric History: reviewed today from last visit and no change.  Past Medical History:  Past Medical History:  Diagnosis Date   ADHD (attention deficit hyperactivity disorder)    Allergies    Anxiety 2019   Asthma 2013   GERD (gastroesophageal reflux disease) 2015   IBS (irritable bowel syndrome) 2021   [facility]   No past surgical history on file.  Family Psychiatric History: As mentioned in  initial H&P, reviewed today, no change   Family History:  Family History  Adopted: Yes  Problem Relation Age of Onset   Drug abuse Mother    ADD / ADHD Sister     Social History:  Social History   Socioeconomic History   Marital status: Single    Spouse name: Not on file   Number of children: Not on file   Years of education: Not on file   Highest education level: Not on file  Occupational History   Not on file  Tobacco Use   Smoking status: Every Day    Packs/day: 0.25    Types: Cigarettes    Smokeless tobacco: Never  Vaping Use   Vaping Use: Every day   Substances: THC  Substance and Sexual Activity   Alcohol use: Not Currently    Comment: she does drink maybe a couple in a 2 week   Drug use: Yes    Types: Marijuana    Comment: last week dont want mother to know.    Sexual activity: Yes    Birth control/protection: Injection  Other Topics Concern   Not on file  Social History Narrative   Not on file   Social Determinants of Health   Financial Resource Strain: Not on file  Food Insecurity: Not on file  Transportation Needs: Not on file  Physical Activity: Not on file  Stress: Not on file  Social Connections: Not on file    Allergies: No Known Allergies  Metabolic Disorder Labs: No results found for: HGBA1C, MPG No results found for: PROLACTIN No results found for: CHOL, TRIG, HDL, CHOLHDL, VLDL, LDLCALC No results found for: TSH  Therapeutic Level Labs: No results found for: LITHIUM No results found for: VALPROATE No components found for:  CBMZ  Current Medications: Current Outpatient Medications  Medication Sig Dispense Refill   escitalopram (LEXAPRO) 5 MG tablet Take 1 tablet (5 mg total) by mouth daily. To be taken with Lexapro 10 mg daily. 30 tablet 1   albuterol (PROVENTIL HFA;VENTOLIN HFA) 108 (90 Base) MCG/ACT inhaler Inhale 2 puffs into the lungs every 6 (six) hours as needed.     escitalopram (LEXAPRO) 10 MG tablet Take 1 tablet (10 mg total) by mouth daily. 30 tablet 1   hydrOXYzine (ATARAX) 25 MG tablet Take 0.5-1 tablets(12.5-25 mg total) by mouth 3(three) times daily as needed for anxiety, and 1 tablet(25 mg total) at bedtime as needed for sleeping difficulties. 30 tablet 0   medroxyPROGESTERone Acetate 150 MG/ML SUSY Inject 1 mL (150 mg total) into the muscle every 3 (three) months. 1 mL 3   nicotine polacrilex (NICORETTE) 2 MG gum Take 1 each (2 mg total) by mouth as needed for smoking cessation. 100 tablet 0   No current  facility-administered medications for this visit.     Musculoskeletal: Strength & Muscle Tone: unable to assess since visit was over the telemedicine.  Gait & Station: unable to assess since visit was over the telemedicine.  Patient leans: N/A  Psychiatric Specialty Exam: ROSReview of 12 systems negative except as mentioned in HPI  There were no vitals taken for this visit.There is no height or weight on file to calculate BMI.  General Appearance: Casual  Eye Contact:  Good  Speech:  Clear and Coherent and Normal Rate  Volume:  Normal  Mood:  "tired"   Affect:  Appropriate, Congruent, and Restricted  Thought Process:  Goal Directed and Linear  Orientation:  Full (Time, Place, and Person)  Thought Content: Logical   Suicidal Thoughts:  No  Homicidal Thoughts:  No  Memory:  Immediate;   Good Recent;   Good Remote;   Good  Judgement:  Good  [facility]:  Shallow  Psychomotor Activity:  Normal  Concentration:  Concentration: Good and Attention Span: Good  Recall:  Good  Fund of Knowledge: Good  Language: Good  Akathisia:  No    AIMS (if indicated): not done  Assets:  Communication Skills Desire for Improvement Financial Resources/Insurance Housing Leisure Time Physical Health Social Support Transportation Vocational/Educational  ADL's:  Intact  Cognition: WNL  Sleep:   Good     Screenings: AUDIT    Flowsheet Row [facility]  Alcohol Use Disorder Identification Test Final Score (AUDIT) 1      PHQ2-9    Flowsheet Row [facility]  PHQ-2 Total Score 3  PHQ-9 Total Score 13      Flowsheet Row Office Visit from 11/28/2021 in [facility] Admission (Discharged) from [facility] ED from [facility]  C-SSRS RISK CATEGORY Error:  Q3, 4, or 5 should not be populated when Q2 is No Error: Q7 should not be populated when Q6 is No High Risk        Assessment and Plan:   This is an 21 year old Caucasian female, currently domiciled with her adoptive parents and twin sister, has psychiatric history significant of disruptive behavior disorder, anxiety and ADHD diagnosed at the age of 7 was not in any psychiatric treatment until 02/2018 referred by her PCP for psychiatric evaluation and med management in 02/2018.   Trialed Concerta - stopped due to loss of appetite and it not being effective, did not want to try anything else. Zoloft titrated up to 75 mg daily but self tapered due to feeling tired but was effective for anxiety. She was started on Prozac and has responded well but self discontinued due to nightsweats.    Plan reviewed on 12/18/2021 - She is following back after three weeks, complaints of more tiredness but no other depressive symptoms reported, more anxiety, and using nicotine and MJA more lately. Recommended to change lexapro to bedtime, and decrease the substance use. Started [facility] therapy and seems to like her new therapist.    # Anxiety (chronic, partially better)Depression (in early remission) - Continue with Lexapro 15 mg daily and switch it to bedtime.   # irritability(chronic and improving) - Lexapro 15  mg daily.   # ADHD (chronic, stable) - Trialed Concerta, stopped because pt did not find it effective and had low appetite.  - Currently doing well. - will continue to monitor.  - Algis DownsJennifer Simms - Every week @ [facility].   # Nicotine and Cannabis abuse - Motivational interviewing, psychoeducation.    Follow up in 6 weeks or early if needed.       MDM = 2 or more chronic stable conditions + med management        Darcel SmallingHiren M Ammara Raj, MD 12/18/2021, 10:58 AM

## 2021-12-19 ENCOUNTER — Other Ambulatory Visit: Payer: Self-pay

## 2021-12-19 DIAGNOSIS — Z3042 Encounter for surveillance of injectable contraceptive: Secondary | ICD-10-CM

## 2021-12-19 MED ORDER — MEDROXYPROGESTERONE ACETATE 150 MG/ML IM SUSY: 150.0000 mg | PREFILLED_SYRINGE | INTRAMUSCULAR | 0 refills | Status: DC

## 2021-12-19 NOTE — Telephone Encounter (Signed)
Pt called triage needing a refill on her depo. She has her annual scheduled on 6/13. Pt aware rx sent in

## 2021-12-26 ENCOUNTER — Other Ambulatory Visit: Payer: Self-pay | Admitting: Child and Adolescent Psychiatry

## 2021-12-26 DIAGNOSIS — F324 Major depressive disorder, single episode, in partial remission: Secondary | ICD-10-CM

## 2021-12-26 DIAGNOSIS — F411 Generalized anxiety disorder: Secondary | ICD-10-CM

## 2021-12-28 ENCOUNTER — Ambulatory Visit (INDEPENDENT_AMBULATORY_CARE_PROVIDER_SITE_OTHER): Payer: BC Managed Care – PPO

## 2021-12-28 DIAGNOSIS — Z3042 Encounter for surveillance of injectable contraceptive: Secondary | ICD-10-CM | POA: Diagnosis not present

## 2021-12-28 MED ORDER — MEDROXYPROGESTERONE ACETATE 150 MG/ML IM SUSP
150.0000 mg | Freq: Once | INTRAMUSCULAR | Status: AC
  Administered 2021-12-28: 150 mg via INTRAMUSCULAR

## 2021-12-28 NOTE — Progress Notes (Signed)
Patient presents today for Depo Provera injection within dates. Given IM lUOQ. Patient tolerated well.

## 2022-01-01 ENCOUNTER — Ambulatory Visit: Payer: BC Managed Care – PPO

## 2022-01-30 ENCOUNTER — Telehealth (INDEPENDENT_AMBULATORY_CARE_PROVIDER_SITE_OTHER): Payer: BC Managed Care – PPO | Admitting: Child and Adolescent Psychiatry

## 2022-01-30 DIAGNOSIS — F411 Generalized anxiety disorder: Secondary | ICD-10-CM | POA: Diagnosis not present

## 2022-01-30 DIAGNOSIS — F320 Major depressive disorder, single episode, mild: Secondary | ICD-10-CM

## 2022-01-30 MED ORDER — ESCITALOPRAM OXALATE 20 MG PO TABS: 20.0000 mg | ORAL_TABLET | Freq: Every day | ORAL | 1 refills | Status: DC

## 2022-01-30 NOTE — Progress Notes (Signed)
Virtual Visit via Video Note  I connected with Nicole Branch on 01/30/22 at  9:00 AM EDT by a video enabled telemedicine application and verified that I am speaking with the correct person using two identifiers.  Location: Patient: home Provider: office   I discussed the limitations of evaluation and management by telemedicine and the availability of in person appointments. The patient expressed understanding and agreed to proceed.    I discussed the assessment and treatment plan with the patient. The patient was provided an opportunity to ask questions and all were answered. The patient agreed with the plan and demonstrated an understanding of the instructions.   The patient was advised to call back or seek an in-person evaluation if the symptoms worsen or if the condition fails to improve as anticipated.  I provided 20 minutes of non-face-to-face time during this encounter.   [name], MD   [facility] MD/PA/NP OP Progress Note  01/30/2022 10:38 AM Nicole Branch  MRN:  [id number]   Chief Complaint:   Medication management follow-up for mood and anxiety.  HPI: This is a 21 year old Caucasian female with psychiatric history significant of generalized anxiety disorder, ADHD, MDD was seen and evaluated in person for medication management follow-up.  She was evaluated separately from her mother and I spoke with her mother with Maxwell's verbal informed consent to obtain collateral information and discuss her treatment plan.  She reports that she feels her Lexapro is not enough.  She reports that she is more irritable when she does not take the medication and believes that Lexapro is helping her with irritability and mood however she still gets irritable occasionally.  She also reports that she gets depressed about 3 days a week intermittently, mood can become very low in the context of thinking about her job and other psychosocial stressors.  She reports that she continues to  remain tired, but sleeps and eats well.  She also reports that she has continued to have difficulties with concentration and wondered if she can start taking ADHD medications.  She reports that she has occasional suicidal thoughts about once a week, denies any intentional plans to act on them and it occurs in he is able to manage by thinking that these thoughts will pass and she does not want to act on them.  She denies any current SI.  She also continues to report intermittent anxiety.  Her psychosocial stressors continue to include dissatisfaction with job, some challenges with interpersonal relationship with her boyfriend and adjustment related to transitioning to young adulthood.  She reports that she continues to use marijuana but not as much as she was before, smokes about once a day.  Also reports vaping nicotine.   We discussed that we can increase the Lexapro to 20 mg once a day for her mood and anxiety.  Discussed that with improvement in anxiety and depression in her concentration problems can improve.  Also provided psychoeducation on decreasing the use of nicotine and marijuana which can also improve attention.  Discussed that in order to get prescription stimulant she will have to come off of marijuana and will require random urine drug screens.  However nonstimulant can be tried at the next appointment.  She verbalized understanding.  Her mother reports that overall she is doing okay however continues to have some concerns regarding her expressing suicidal thoughts occasionally.  We discussed the safety plan, recommended increasing the dose of Lexapro to manage her mood and anxiety symptoms and work on  to alleviate psychosocial stressors.  She verbalized understanding.  They will follow back again in a month or earlier if needed.  Patient has been seeing therapist once a week but has not followed up since last 2 weeks and recommended to get in therapy weekly.  She verbalized  understanding.  She reports that recently she has been feeling tired a lot.  She reports that it started more since she has increased the dose of Lexapro to 15 mg.  She is taking medication in the morning.  We discussed that it could be because of Lexapro and recommended her to switch to bedtime and if she continues to feel tired a lot during the day then give call back in next 2 weeks and we will consider adjusting the medication.  She reports that her mood is tired but not depressed.  She reports that she is more anxious lately, has been vaping nicotine and marijuana in significant quantity lately.  She denies any SI or HI.  She reports that she is sleeping 9 hours at night, and still takes a nap after she comes back from work.  She denies any other psychosocial stressors.  I discussed with her that her tiredness could be in the context of her increased marijuana and nicotine use and provided psychoeducation on decreasing the use.  She reports that she started seeing therapist at [facility] and had 2 sessions of her, it is going well for her and she likes her new therapist.  She has been seeing her every week.  Her mother denies concerns for today's appointment, and reports that overall she seems to be doing "okay".  They will follow back again in 6 weeks or earlier if needed.  Visit Diagnosis:    ICD-10-CM   1. Generalized anxiety disorder  F41.1 escitalopram (LEXAPRO) 20 MG tablet    2. Current mild episode of major depressive disorder without prior episode (HCC)  F32.0 escitalopram (LEXAPRO) 20 MG tablet         Past Psychiatric History: reviewed today from last visit and no change.  Past Medical History:  Past Medical History:  Diagnosis Date   ADHD (attention deficit hyperactivity disorder)    Allergies    Anxiety 2019   Asthma 2013   GERD (gastroesophageal reflux disease) 2015   IBS (irritable bowel syndrome) 2021   [facility]   No past surgical history on file.  Family  Psychiatric History: As mentioned in initial H&P, reviewed today, no change   Family History:  Family History  Adopted: Yes  Problem Relation Age of Onset   Drug abuse Mother    ADD / ADHD Sister     Social History:  Social History   Socioeconomic History   Marital status: Single    Spouse name: Not on file   Number of children: Not on file   Years of education: Not on file   Highest education level: Not on file  Occupational History   Not on file  Tobacco Use   Smoking status: Every Day    Packs/day: 0.25    Types: Cigarettes   Smokeless tobacco: Never  Vaping Use   Vaping Use: Every day   Substances: THC  Substance and Sexual Activity   Alcohol use: Not Currently    Comment: she does drink maybe a couple in a 2 week   Drug use: Yes    Types: Marijuana    Comment: last week dont want mother to know.    Sexual activity: Yes  Birth control/protection: Injection  Other Topics Concern   Not on file  Social History Narrative   Not on file   Social Determinants of Health   Financial Resource Strain: Not on file  Food Insecurity: Not on file  Transportation Needs: Not on file  Physical Activity: Sufficiently Active (03/10/2018)   Exercise Vital Sign    Days of Exercise per Week: 5 days    Minutes of Exercise per Session: 30 min  Stress: Stress Concern Present (03/10/2018)   [employer] of Occupational Health - Occupational Stress Questionnaire    Feeling of Stress : Very much  Social Connections: Unknown (03/10/2018)   Social Connection and Isolation Panel [NHANES]    Frequency of Communication with Friends and Family: Not on file    Frequency of Social Gatherings with Friends and Family: Not on file    Attends Religious Services: More than 4 times per year    Active Member of [employer] or Organizations: Yes    Attends Engineer, structural: More than 4 times per year    Marital Status: Never married    Allergies: No Known Allergies  Metabolic  Disorder Labs: No results found for: "HGBA1C", "MPG" No results found for: "PROLACTIN" No results found for: "CHOL", "TRIG", "HDL", "CHOLHDL", "VLDL", "LDLCALC" No results found for: "TSH"  Therapeutic Level Labs: No results found for: "LITHIUM" No results found for: "VALPROATE" No results found for: "CBMZ"  Current Medications: Current Outpatient Medications  Medication Sig Dispense Refill   albuterol (PROVENTIL HFA;VENTOLIN HFA) 108 (90 Base) MCG/ACT inhaler Inhale 2 puffs into the lungs every 6 (six) hours as needed.     escitalopram (LEXAPRO) 20 MG tablet Take 1 tablet (20 mg total) by mouth daily. 30 tablet 1   hydrOXYzine (ATARAX) 25 MG tablet Take 0.5-1 tablets(12.5-25 mg total) by mouth 3(three) times daily as needed for anxiety, and 1 tablet(25 mg total) at bedtime as needed for sleeping difficulties. 30 tablet 0   medroxyPROGESTERone Acetate 150 MG/ML SUSY Inject 1 mL (150 mg total) into the muscle every 3 (three) months. 1 mL 0   nicotine polacrilex (NICORETTE) 2 MG gum Take 1 each (2 mg total) by mouth as needed for smoking cessation. 100 tablet 0   No current facility-administered medications for this visit.     Musculoskeletal: Strength & Muscle Tone: unable to assess since visit was over the telemedicine.  Gait & Station: unable to assess since visit was over the telemedicine.  Patient leans: N/A  Psychiatric Specialty Exam: ROSReview of 12 systems negative except as mentioned in HPI  There were no vitals taken for this visit.There is no height or weight on file to calculate BMI.  General Appearance: Casual  Eye Contact:  Good  Speech:  Clear and Coherent and Normal Rate  Volume:  Normal  Mood:  "ok.."   Affect:  Appropriate, Congruent, and Full Range  Thought Process:  Goal Directed and Linear  Orientation:  Full (Time, Place, and Person)  Thought Content: Logical   Suicidal Thoughts:  No  Homicidal Thoughts:  No  Memory:  Immediate;   Good Recent;    Good Remote;   Good  Judgement:  Good  [facility]:  Shallow  Psychomotor Activity:  Normal  Concentration:  Concentration: Good and Attention Span: Good  Recall:  Good  Fund of Knowledge: Good  Language: Good  Akathisia:  No    AIMS (if indicated): not done  Assets:  Communication Skills Desire for Improvement Financial Resources/Insurance Housing Leisure Time  Physical Health Social Support Transportation Vocational/Educational  ADL's:  Intact  Cognition: WNL  Sleep:   Good     Screenings: AUDIT    Flowsheet Row Admission (Discharged) from 11/25/2021 in [facility] BE[facility]AVIORAL MEDICINE  Alcohol Use Disorder Identification Test Final Score (AUDIT) 1      PHQ2-9    Flowsheet Row Office Visit from 11/28/2021 in [facility]  PHQ-2 Total Score 3  PHQ-9 Total Score 13      Flowsheet Row Office Visit from 11/28/2021 in [facility] Admission (Discharged) from 11/25/2021 in [facility] INPATIENT BEHAVIORAL MEDICINE ED from 11/24/2021 in [facility]  C-SSRS RISK CATEGORY Error: Q3, 4, or 5 should not be populated when Q2 is No Error: Q7 should not be populated when Q6 is No High Risk        Assessment and Plan:   This is an 21 year old Caucasian female, currently domiciled with her adoptive parents and twin sister, has psychiatric history significant of disruptive behavior disorder, anxiety and ADHD diagnosed at the age of 7 was not in any psychiatric treatment until 02/2018 referred by her PCP for psychiatric evaluation and med management in 02/2018.   Trialed Concerta - stopped due to loss of appetite and it not being effective, did not want to try anything else. Zoloft titrated up to 75 mg daily but self tapered due to feeling tired but was effective for anxiety. She was started on Prozac and has responded well but self discontinued due to nightsweats.    Plan reviewed on 01/30/22 -  She is following back after 6 weeks, reports intermittent depressed mood, anhedonia, anxiety and occasional SI in the context of chronic psychosocial stressors. Also expresses concerns regarding concentration problems, has hx of ADHD, discussed treatment options, recommended to wait to see the response from Lexapro and encouraged her to cut down on susbtance abuse. Will consider Straterra/Qelbree vs stimulant if she continues have problems with attention and abuse cannabis .Recommending to increase lexapro to 20 mg daily.    # Anxiety (chronic, partially better)Depression (mild to moderate) - Increase Lexapro to 20 mg daily at bedtime.  - Therapy with Algis Downs - Every week @ [facility].  # irritability(chronic and improving) - Lexapro 20 mg daily.   # ADHD (chronic, unstable) - Trialed Concerta, stopped because pt did not find it effective and had low appetite.  - Consider trial of straterra/qelbree vs stimulant due to substance abuse at the next appointment.   # Nicotine and Cannabis abuse - Motivational interviewing, psychoeducation.    Follow up in 6 weeks or early if needed.       MDM = 2 or more chronic stable conditions + med management        [name], MD 01/30/2022, 10:38 AM

## 2022-02-12 ENCOUNTER — Telehealth: Payer: Self-pay

## 2022-02-12 ENCOUNTER — Telehealth (INDEPENDENT_AMBULATORY_CARE_PROVIDER_SITE_OTHER): Payer: BC Managed Care – PPO | Admitting: Child and Adolescent Psychiatry

## 2022-02-12 DIAGNOSIS — F1210 Cannabis abuse, uncomplicated: Secondary | ICD-10-CM

## 2022-02-12 DIAGNOSIS — F321 Major depressive disorder, single episode, moderate: Secondary | ICD-10-CM

## 2022-02-12 DIAGNOSIS — Z720 Tobacco use: Secondary | ICD-10-CM

## 2022-02-12 DIAGNOSIS — F411 Generalized anxiety disorder: Secondary | ICD-10-CM

## 2022-02-12 NOTE — Progress Notes (Signed)
Virtual Visit via Video Note  I connected with Nicole Branch on 02/12/22 at 11:00 AM EDT by a video enabled telemedicine application and verified that I am speaking with the correct person using two identifiers.  Location: Patient: home Provider: office   I discussed the limitations of evaluation and management by telemedicine and the availability of in person appointments. The patient expressed understanding and agreed to proceed.    I discussed the assessment and treatment plan with the patient. The patient was provided an opportunity to ask questions and all were answered. The patient agreed with the plan and demonstrated an understanding of the instructions.   The patient was advised to call back or seek an in-person evaluation if the symptoms worsen or if the condition fails to improve as anticipated.  I provided 50 minutes of non-face-to-face time during this encounter.   [name], MD   [facility] MD/PA/NP OP Progress Note  02/12/2022 1:55 PM Nicole Branch  MRN:  [id number]   Chief Complaint:   Mood problems, anxiety, "last week I was having suicidal thoughts..."(pt).   HPI: This is a 21 year old female with psychiatric history significant of generalized anxiety disorder, ADHD, MDD. Her mother called this morning and reported that pt needs re-evaluation and not doing well. Recommended CMA to call pt and schedule an early appointment and therefore pt was scheduled for appointment today. She was seen and evaluated over telemedicine encounter for medication management follow up.  She was accompanied with her mother and was evaluated separately from her mother and I spoke with her mother with Amali's verbal informed consent to obtain collateral information. Pt defers the treatment decisions to mother and therefore discussed her treatment plan with mother.  Nicole Branch reports that she thinks she needs to be re-evaluated.   She reports that her mood continues to fluctuate,  she has highs that lasts for about an hour during which she is more energetic and then lows during which she is not energetic or motivated.   She reports that they occur without specific triggers, but reports that if something bad happens to her, she tells herself that she is better than this and that leads to elevated mood for an hour. She reports that she would clean her room, is more energetic. She reports that she is always stressed, more when she is alone, thinks that she is not where she wants to be with her job and making money, in her life.  She reports that she becomes "foggy.." which she describes as not being able to have conversation with others as she cannot control what she says or does. She also reports becoming irritable easily. She however denies grandiosity, denies problems with sleep and in fact sleeps more, talks as usual, no risky behaviors. She reports that she is tired often which has been a long-term problem for her. She denies problems with appetite. She reports that last Thursday she was by herself, was thinking about her stressors and started having suicidal thoughts. She reports that at that time she did not care, her parents came home which helped with these thoughts and since then she has not had any suicidal thoughts. She denies any current SI or HI. She reports that her parents have been helpful since then, she met her best friend recently, and has been spending time with her boyfriend. She reports that she is trying to find a new job as she does not like her current job and not making enough money or  getting enough hours. This is also a stressor for her.   She reports that she was smoking cannabis all the time for last 1.5 years, about three weeks ago cut down to twice a day (morning and at evening), continues to vape but reports vaping less (she kept vaping during the evaluation today). Reports that she has not drank alcohol for the past 4 weeks, previous was about every other  week, has usually about three drinks when she drinks, a year ago she had more drinks on one occasion and had black out. She denies any other substance abuse.   Her mother reports that Nicole Branch has lack of energy and motivation, last Thursday she scared them, reported having SI, since then she and pt's father has been rallying around her and encouraging her. She reports that she concerned about leaving pt alone due to her SI last Thursday. She reports that she spoke with her therapist who is currently on vacation and returning early next month, who suggested to have re-evaluation and has concerns for bipolar disorder.  I discussed pt separately and her mother that this seems less likely as she does not have symptoms except irritability, impulsivity that are consistent with manic or hypomanic episodes. Her mood fluctuates often, and elevated mood is usually for about an hour with some increased energy during that time. I discussed that it is most likely her anxiety and depression in the context of current psychosocial stressors and substance abuse.    Visit Diagnosis:    ICD-10-CM   1. Current moderate episode of major depressive disorder without prior episode ([place])  F32.1     2. Generalized anxiety disorder  F41.1     3. Cannabis abuse  F12.10     4. Nicotine abuse  Z72.0          Past Psychiatric History: reviewed today from last visit and no change.  Past Medical History:  Past Medical History:  Diagnosis Date   ADHD (attention deficit hyperactivity disorder)    Allergies    Anxiety 2019   Asthma 2013   GERD (gastroesophageal reflux disease) 2015   IBS (irritable bowel syndrome) 2021   [facility]   No past surgical history on file.  Family Psychiatric History: As mentioned in initial H&P, reviewed today, no change   Family History:  Family History  Adopted: Yes  Problem Relation Age of Onset   Drug abuse Mother    ADD / ADHD Sister     Social History:  Social History    Socioeconomic History   Marital status: Single    Spouse name: Not on file   Number of children: Not on file   Years of education: Not on file   Highest education level: Not on file  Occupational History   Not on file  Tobacco Use   Smoking status: Every Day    Packs/day: 0.25    Types: Cigarettes   Smokeless tobacco: Never  Vaping Use   Vaping Use: Every day   Substances: THC  Substance and Sexual Activity   Alcohol use: Not Currently    Comment: she does drink maybe a couple in a 2 week   Drug use: Yes    Types: Marijuana    Comment: last week dont want mother to know.    Sexual activity: Yes    Birth control/protection: Injection  Other Topics Concern   Not on file  Social History Narrative   Not on file   Social Determinants of Health  Financial Resource Strain: Not on file  Food Insecurity: Not on file  Transportation Needs: Not on file  Physical Activity: Sufficiently Active (03/10/2018)   Exercise Vital Sign    Days of Exercise per Week: 5 days    Minutes of Exercise per Session: 30 min  Stress: Stress Concern Present (03/10/2018)   [place]    Feeling of Stress : Very much  Social Connections: Unknown (03/10/2018)   Social Connection and Isolation Panel [NHANES]    Frequency of Communication with Friends and Family: Not on file    Frequency of Social Gatherings with Friends and Family: Not on file    Attends Religious Services: More than 4 times per year    Active Member of [employer] or Organizations: Yes    Attends Music therapist: More than 4 times per year    Marital Status: Never married    Allergies: No Known Allergies  Metabolic Disorder Labs: No results found for: "HGBA1C", "MPG" No results found for: "PROLACTIN" No results found for: "CHOL", "TRIG", "HDL", "CHOLHDL", "VLDL", "LDLCALC" No results found for: "TSH"  Therapeutic Level Labs: No results found for:  "LITHIUM" No results found for: "VALPROATE" No results found for: "CBMZ"  Current Medications: Current Outpatient Medications  Medication Sig Dispense Refill   albuterol (PROVENTIL HFA;VENTOLIN HFA) 108 (90 Base) MCG/ACT inhaler Inhale 2 puffs into the lungs every 6 (six) hours as needed.     escitalopram (LEXAPRO) 20 MG tablet Take 1 tablet (20 mg total) by mouth daily. 30 tablet 1   hydrOXYzine (ATARAX) 25 MG tablet Take 0.5-1 tablets(12.5-25 mg total) by mouth 3(three) times daily as needed for anxiety, and 1 tablet(25 mg total) at bedtime as needed for sleeping difficulties. 30 tablet 0   medroxyPROGESTERone Acetate 150 MG/ML SUSY Inject 1 mL (150 mg total) into the muscle every 3 (three) months. 1 mL 0   nicotine polacrilex (NICORETTE) 2 MG gum Take 1 each (2 mg total) by mouth as needed for smoking cessation. 100 tablet 0   No current facility-administered medications for this visit.     Musculoskeletal: Strength & Muscle Tone: unable to assess since visit was over the telemedicine.  Gait & Station: unable to assess since visit was over the telemedicine.  Patient leans: N/A  Psychiatric Specialty Exam: ROSReview of 12 systems negative except as mentioned in HPI  There were no vitals taken for this visit.There is no height or weight on file to calculate BMI.  General Appearance: Casual  Eye Contact:  Good  Speech:  Clear and Coherent and Normal Rate  Volume:  Normal  Mood:  "ok.."   Affect:  Appropriate, Congruent, and Restricted  Thought Process:  Goal Directed and Linear  Orientation:  Full (Time, Place, and Person)  Thought Content: Logical   Suicidal Thoughts:  No  Homicidal Thoughts:  No  Memory:  Immediate;   Good Recent;   Good Remote;   Good  Judgement:  Good  [facility]:  Shallow  Psychomotor Activity:  Normal  Concentration:  Concentration: Good and Attention Span: Good  Recall:  Good  Fund of Knowledge: Good  Language: Good  Akathisia:  No    AIMS (if  indicated): not done  Assets:  Communication Skills Desire for Improvement Financial Resources/Insurance Housing Leisure Time Physical Health Social Support Transportation Vocational/Educational  ADL's:  Intact  Cognition: WNL  Sleep:   Good     Screenings: AUDIT    Flowsheet Row Admission (  Discharged) from 11/25/2021 in [facility]  Alcohol Use Disorder Identification Test Final Score (AUDIT) 1      PHQ2-9    [place] Office Visit from 11/28/2021 in [facility]  PHQ-2 Total Score 3  PHQ-9 Total Score 13      [place] Office Visit from 11/28/2021 in [facility] Admission (Discharged) from 11/25/2021 in [facility] ED from 11/24/2021 in [facility] Error: Q3, 4, or 5 should not be populated when Q2 is No Error: Q7 should not be populated when Q6 is No High Risk        Assessment and Plan:   This is an 21 year old Caucasian female, currently domiciled with her adoptive parents and twin sister, has psychiatric history significant of disruptive behavior disorder, anxiety and ADHD diagnosed at the age of 44 was not in any psychiatric treatment until 02/2018 referred by her PCP for psychiatric evaluation and med management in 02/2018.   Trialed Concerta - stopped due to loss of appetite and it not being effective, did not want to try anything else. Zoloft titrated up to 75 mg daily but self tapered due to feeling tired but was effective for anxiety. She was started on Prozac and has responded well but self discontinued due to nightsweats.    Plan reviewed on 02/12/2022 - She is following back after 2 weeks, mother called due to concerns, recommended earlier appointment and therefore seen today. She appears to continue to have intermittent depressed mood, anhedonia, anxiety and occasional SI in the context of  chronic psychosocial stressors, last SI last Thursday none since then.  Mother expressed concerns that her suicidal thoughts could have been due to medications. I discussed with her that pt had SI prior to starting this medication, SI has not present at this time, and less likely due to medication.  I discussed recommendation to add abilify for mood stabilization and as an adjunctive treatment for depression. Discussed benefits, risks and side effects associated with it. Alternatively discussed to change Lexapro to Wellbutrin. Mother reports that she would like to discuss with pt's father before considering medication option. Discussed the recommendation for Partial hospitalization program or intensive outpatient program at [facility], again they would like to discuss with pt's father before considering it. Offered a referral for psychological evaluation to address their concerns regarding diagnostic clarification. Discussed that it may take some time before that can have that done due to long wait. She will let me know her decision regarding that at the next appointment in 1 week.   Mother is advised to keep safety precautions at home including but not limited to locking medications including OTC meds, locking all the sharps and knives, increased supervision, locking up firearms which they did and pt does not have access to it, and call 911/or bring pt to ER for any safety concerns.     Also expresses concerns regarding concentration problems, has hx of ADHD, discussed treatment options previously, recommended to wait to stabilize her anxiety/mood, encouraged her to cut down on susbtance abuse. Will consider Straterra/Qelbree vs stimulant if she continues to have problems with attention and does not abuse cannabis.   # Anxiety (chronic, partially better)Depression (moderate) - Continue Lexapro 20 mg daily at bedtime. Recommended Abilify 2 mg daily for mood stabilization, they will discuss with pt's father  before deciding on it.  - Therapy with Raynelle Jan - Every week @ [facility].   #  irritability(chronic and notimproving) - Lexapro 20 mg daily. - Recommended abilify 2 mg daily. Will decide and let me know if they would like to start.  - psychoeducation on cutting down on cannabis and nicotine.    # ADHD (chronic, unstable) - Trialed Concerta, stopped because pt did not find it effective and had low appetite.  - Consider trial of straterra/qelbree vs stimulant due to substance abuse once anxiety/mood stable.   # Nicotine and Cannabis abuse - Motivational interviewing, psychoeducation.    Follow up in 1 week or early if needed.   50 minutes total time for encounter today which included chart review, pt evaluation, collaterals, medication and other treatment discussions, medication orders and charting.            [name], MD 02/12/2022, 1:55 PM

## 2022-02-12 NOTE — Telephone Encounter (Signed)
Shanda Bumps - Can you please call mother and get more details. Please offer an early appointment. Thanks

## 2022-02-12 NOTE — Telephone Encounter (Signed)
PT mother called stating that her daughter is not getting any better and is getting worse. Therapist recommends getting her re-evaluated and she wants to talk about her medications.

## 2022-02-12 NOTE — Telephone Encounter (Signed)
Called and made an appt for patient virtal appt.

## 2022-02-13 ENCOUNTER — Other Ambulatory Visit: Payer: Self-pay | Admitting: Child and Adolescent Psychiatry

## 2022-02-13 DIAGNOSIS — F411 Generalized anxiety disorder: Secondary | ICD-10-CM

## 2022-02-13 DIAGNOSIS — F320 Major depressive disorder, single episode, mild: Secondary | ICD-10-CM

## 2022-02-19 ENCOUNTER — Telehealth: Payer: BC Managed Care – PPO | Admitting: Child and Adolescent Psychiatry

## 2022-02-19 ENCOUNTER — Ambulatory Visit: Payer: BC Managed Care – PPO | Admitting: Obstetrics and Gynecology

## 2022-02-19 ENCOUNTER — Telehealth (INDEPENDENT_AMBULATORY_CARE_PROVIDER_SITE_OTHER): Payer: BC Managed Care – PPO | Admitting: Child and Adolescent Psychiatry

## 2022-02-19 DIAGNOSIS — Z79899 Other long term (current) drug therapy: Secondary | ICD-10-CM

## 2022-02-19 DIAGNOSIS — F320 Major depressive disorder, single episode, mild: Secondary | ICD-10-CM

## 2022-02-19 DIAGNOSIS — F411 Generalized anxiety disorder: Secondary | ICD-10-CM | POA: Diagnosis not present

## 2022-02-19 MED ORDER — ESCITALOPRAM OXALATE 10 MG PO TABS: 10.0000 mg | ORAL_TABLET | Freq: Every day | ORAL | 0 refills | Status: DC

## 2022-02-19 MED ORDER — ESCITALOPRAM OXALATE 5 MG PO TABS: 5.0000 mg | ORAL_TABLET | Freq: Every day | ORAL | 0 refills | Status: DC

## 2022-02-19 MED ORDER — ARIPIPRAZOLE 2 MG PO TABS: 2.0000 mg | ORAL_TABLET | Freq: Every day | ORAL | 0 refills | Status: DC

## 2022-02-19 NOTE — Progress Notes (Deleted)
PCP:  Pcp, No   No chief complaint on file.    HPI:      Ms. Nicole Branch is a 21 y.o. G0P0000 whose LMP was No LMP recorded. Patient has had an injection., presents today for her annual examination.  Her menses are absent due to depo. No BTB, no dysmen. Doing well, wants to continue  Sex activity: single partner, contraception - Depo-Provera injections.  Last Pap: N/A due to age Hx of STDs: none  There is no FH of breast cancer. There is no FH of ovarian cancer. The patient does not do self-breast exams.  Tobacco use: vapes daily Alcohol use: none No drug use.  Exercise: moderately active  She does not get adequate calcium and Vitamin D in her diet.  Gardasil with PCP    Past Medical History:  Diagnosis Date   ADHD (attention deficit hyperactivity disorder)    Allergies    Anxiety 2019   Asthma 2013   GERD (gastroesophageal reflux disease) 2015   IBS (irritable bowel syndrome) 2021   [facility]    No past surgical history on file.  Family History  Adopted: Yes  Problem Relation Age of Onset   Drug abuse Mother    ADD / ADHD Sister     Social History   Socioeconomic History   Marital status: Single    Spouse name: Not on file   Number of children: Not on file   Years of education: Not on file   Highest education level: Not on file  Occupational History   Not on file  Tobacco Use   Smoking status: Every Day    Packs/day: 0.25    Types: Cigarettes   Smokeless tobacco: Never  Vaping Use   Vaping Use: Every day   Substances: THC  Substance and Sexual Activity   Alcohol use: Not Currently    Comment: she does drink maybe a couple in a 2 week   Drug use: Yes    Types: Marijuana    Comment: last week dont want mother to know.    Sexual activity: Yes    Birth control/protection: Injection  Other Topics Concern   Not on file  Social History Narrative   Not on file   Social Determinants of Health   Financial Resource Strain: Not on file   Food Insecurity: Not on file  Transportation Needs: Not on file  Physical Activity: Sufficiently Active (03/10/2018)   Exercise Vital Sign    Days of Exercise per Week: 5 days    Minutes of Exercise per Session: 30 min  Stress: Stress Concern Present (03/10/2018)   [employer] of Occupational Health - Occupational Stress Questionnaire    Feeling of Stress : Very much  Social Connections: Unknown (03/10/2018)   Social Connection and Isolation Panel [NHANES]    Frequency of Communication with Friends and Family: Not on file    Frequency of Social Gatherings with Friends and Family: Not on file    Attends Religious Services: More than 4 times per year    Active Member of [employer] or Organizations: Yes    Attends Banker Meetings: More than 4 times per year    Marital Status: Never married  Intimate Partner Violence: Not At Risk (03/10/2018)   Humiliation, Afraid, Rape, and Kick questionnaire    Fear of Current or Ex-Partner: No    Emotionally Abused: No    Physically Abused: No    Sexually Abused: No  Current Outpatient Medications:    albuterol (PROVENTIL HFA;VENTOLIN HFA) 108 (90 Base) MCG/ACT inhaler, Inhale 2 puffs into the lungs every 6 (six) hours as needed., Disp: , Rfl:    ARIPiprazole (ABILIFY) 2 MG tablet, Take 1 tablet (2 mg total) by mouth daily., Disp: 30 tablet, Rfl: 0   escitalopram (LEXAPRO) 10 MG tablet, Take 1 tablet (10 mg total) by mouth daily., Disp: 30 tablet, Rfl: 0   escitalopram (LEXAPRO) 5 MG tablet, Take 1 tablet (5 mg total) by mouth daily. To be taken with Lexapro 10 mg daily., Disp: 30 tablet, Rfl: 0   hydrOXYzine (ATARAX) 25 MG tablet, Take 0.5-1 tablets(12.5-25 mg total) by mouth 3(three) times daily as needed for anxiety, and 1 tablet(25 mg total) at bedtime as needed for sleeping difficulties., Disp: 30 tablet, Rfl: 0   medroxyPROGESTERone Acetate 150 MG/ML SUSY, Inject 1 mL (150 mg total) into the muscle every 3 (three) months., Disp: 1  mL, Rfl: 0   nicotine polacrilex (NICORETTE) 2 MG gum, Take 1 each (2 mg total) by mouth as needed for smoking cessation., Disp: 100 tablet, Rfl: 0     ROS:  Review of Systems  Constitutional:  Negative for fatigue, fever and unexpected weight change.  Respiratory:  Negative for cough, shortness of breath and wheezing.   Cardiovascular:  Negative for chest pain, palpitations and leg swelling.  Gastrointestinal:  Negative for blood in stool, constipation, diarrhea, nausea and vomiting.  Endocrine: Negative for cold intolerance, heat intolerance and polyuria.  Genitourinary:  Negative for dyspareunia, dysuria, flank pain, frequency, genital sores, hematuria, menstrual problem, pelvic pain, urgency, vaginal bleeding, vaginal discharge and vaginal pain.  Musculoskeletal:  Negative for back pain, joint swelling and myalgias.  Skin:  Negative for rash.  Neurological:  Negative for dizziness, syncope, light-headedness, numbness and headaches.  Hematological:  Negative for adenopathy.  Psychiatric/Behavioral:  Negative for agitation, confusion, sleep disturbance and suicidal ideas. The patient is not nervous/anxious.   BREAST: No symptoms   Objective: There were no vitals taken for this visit.   Physical Exam Constitutional:      Appearance: She is well-developed.  Genitourinary:     Vulva normal.     Right Labia: No rash, tenderness or lesions.    Left Labia: No tenderness, lesions or rash.    Vaginal bleeding present.     No vaginal discharge, erythema or tenderness.      Right Adnexa: not tender and no mass present.    Left Adnexa: not tender and no mass present.    No cervical friability or polyp.     Uterus is not enlarged or tender.  Breasts:    Right: No mass, nipple discharge, skin change or tenderness.     Left: No mass, nipple discharge, skin change or tenderness.  Neck:     Thyroid: No thyromegaly.  Cardiovascular:     Rate and Rhythm: Normal rate and regular rhythm.      Heart sounds: Normal heart sounds. No murmur heard. Pulmonary:     Effort: Pulmonary effort is normal.     Breath sounds: Normal breath sounds.  Abdominal:     Palpations: Abdomen is soft.     Tenderness: There is no abdominal tenderness. There is no guarding or rebound.  Musculoskeletal:        General: Normal range of motion.     Cervical back: Normal range of motion.  Lymphadenopathy:     Cervical: No cervical adenopathy.  Neurological:     General:  No focal deficit present.     Mental Status: She is alert and oriented to person, place, and time.     Cranial Nerves: No cranial nerve deficit.  Skin:    General: Skin is warm and dry.  Psychiatric:        Mood and Affect: Mood normal.        Behavior: Behavior normal.        Thought Content: Thought content normal.        Judgment: Judgment normal.  Vitals reviewed.    Assessment/Plan: Encounter for annual routine gynecological examination  Screening for STD (sexually transmitted disease) - Plan: Cervicovaginal ancillary only  Encounter for surveillance of injectable contraceptive - Plan: medroxyPROGESTERone Acetate 150 MG/ML SUSY; depo today with Rx RF. Increase ca/Vit D with supp.  No orders of the defined types were placed in this encounter.            GYN counsel adequate intake of calcium and vitamin D, diet and exercise     F/U  No follow-ups on file.  Miciah Shealy B. Syrai Gladwin, PA-C 02/19/2022 12:04 PM

## 2022-02-19 NOTE — Progress Notes (Signed)
Virtual Visit via Video Note  I connected with Nicole Branch on 02/19/22 at  8:30 AM EDT by a video enabled telemedicine application and verified that I am speaking with the correct person using two identifiers.  Location: Patient: home Provider: office   I discussed the limitations of evaluation and management by telemedicine and the availability of in person appointments. The patient expressed understanding and agreed to proceed.    I discussed the assessment and treatment plan with the patient. The patient was provided an opportunity to ask questions and all were answered. The patient agreed with the plan and demonstrated an understanding of the instructions.   The patient was advised to call back or seek an in-person evaluation if the symptoms worsen or if the condition fails to improve as anticipated.  I provided 30 minutes of non-face-to-face time during this encounter.   Darcel SmallingHiren M Daxtyn Rottenberg, MD   [facility] MD/PA/NP OP Progress Note  02/19/2022 9:24 AM Nicole Branch  MRN:  [id number]   Chief Complaint:   Medication management follow-up for mood, anxiety.  HPI: This is a 21 year old female with psychiatric history significant of generalized anxiety disorder, ADHD, MDD.  She was last seen last week, was recommended Abilify for concerns regarding mood instability as well as to use as an adjunct for for depression.  They decided to hold off to discuss about it within the family and requested and follow up a week later.  Therefore she was seen and evaluated today over telemedicine encounter.  She was accompanied with her mother and was evaluated separately and jointly with her mother.  Patient has signed informed consent to have mother involved in her treatment previously and today provides verbal informed consent for this writer to speak with her to obtain collateral information and discuss her treatment plan.  Nicole Branch reports that her mood has been the same, she has moments where  her mood is stable and spurts where she feels numb.  She reports that she tries to keep herself busy which has been helping with her mood.  She continues to report that her mood fluctuates without no specific triggers or thoughts. She reports that sometimes she struggles with sleep but because she takes naps during the day.  She denies any low lows however reports irritability.  She denies any SI or HI recently.  She continues to use marijuana 2 times a day, and vapes nicotine frequently.  She continues to work about 3 days a week and currently looking for a different job.  She reports that her mom looked into the medication she was recommended last week, and read that it has been helpful so she would like to try.  We discussed Abilify, discussed risks and benefits, side effects including but not limited to metabolic side effects associated with Abilify.  Recommended the requirement of regular blood work to check her cholesterol and hemoglobin A1c.  She verbalized understanding, provided verbal informed consent.  Her mother reports that they have been helping her with building up her resume and apply for other jobs, also looking for options for studies at the colleges, and she has noted her drawing which she used to do when she was young and also started journaling.  Mother reports that because of some confusion at the pharmacy patient has been taking Lexapro 10 mg since last 3 weeks and prior to that she was taking 15 mg once a day.  She reports that she went to pick up Lexapro 20 mg but  they said that she cannot because insurance will not approve it as they just picked up 10 mg.  I discussed with mother to at least get her back on 15 mg once a day while starting on Abilify 2 mg once a day.  Mother verbalized understanding and agreed with this plan.  We sent blood work request to patient's email as well as release of information to speak with her therapist.  They will follow back again in about 4 weeks or earlier  if needed.   Visit Diagnosis:    ICD-10-CM   1. Current mild episode of major depressive disorder without prior episode (HCC)  F32.0 escitalopram (LEXAPRO) 10 MG tablet    escitalopram (LEXAPRO) 5 MG tablet    ARIPiprazole (ABILIFY) 2 MG tablet    2. Generalized anxiety disorder  F41.1 escitalopram (LEXAPRO) 10 MG tablet    escitalopram (LEXAPRO) 5 MG tablet    ARIPiprazole (ABILIFY) 2 MG tablet    3. Other long term (current) drug therapy  Z79.899 Hemoglobin A1c    Lipid panel         Past Psychiatric History: reviewed today from last visit and no change.  Past Medical History:  Past Medical History:  Diagnosis Date   ADHD (attention deficit hyperactivity disorder)    Allergies    Anxiety 2019   Asthma 2013   GERD (gastroesophageal reflux disease) 2015   IBS (irritable bowel syndrome) 2021   [facility]   No past surgical history on file.  Family Psychiatric History: As mentioned in initial H&P, reviewed today, no change   Family History:  Family History  Adopted: Yes  Problem Relation Age of Onset   Drug abuse Mother    ADD / ADHD Sister     Social History:  Social History   Socioeconomic History   Marital status: Single    Spouse name: Not on file   Number of children: Not on file   Years of education: Not on file   Highest education level: Not on file  Occupational History   Not on file  Tobacco Use   Smoking status: Every Day    Packs/day: 0.25    Types: Cigarettes   Smokeless tobacco: Never  Vaping Use   Vaping Use: Every day   Substances: THC  Substance and Sexual Activity   Alcohol use: Not Currently    Comment: she does drink maybe a couple in a 2 week   Drug use: Yes    Types: Marijuana    Comment: last week dont want mother to know.    Sexual activity: Yes    Birth control/protection: Injection  Other Topics Concern   Not on file  Social History Narrative   Not on file   Social Determinants of Health   Financial Resource Strain:  Not on file  Food Insecurity: Not on file  Transportation Needs: Not on file  Physical Activity: Sufficiently Active (03/10/2018)   Exercise Vital Sign    Days of Exercise per Week: 5 days    Minutes of Exercise per Session: 30 min  Stress: Stress Concern Present (03/10/2018)   [employer] of Occupational Health - Occupational Stress Questionnaire    Feeling of Stress : Very much  Social Connections: Unknown (03/10/2018)   Social Connection and Isolation Panel [NHANES]    Frequency of Communication with Friends and Family: Not on file    Frequency of Social Gatherings with Friends and Family: Not on file    Attends Religious Services:  More than 4 times per year    Active Member of Clubs or Organizations: Yes    Attends Banker Meetings: More than 4 times per year    Marital Status: Never married    Allergies: No Known Allergies  Metabolic Disorder Labs: No results found for: "HGBA1C", "MPG" No results found for: "PROLACTIN" No results found for: "CHOL", "TRIG", "HDL", "CHOLHDL", "VLDL", "LDLCALC" No results found for: "TSH"  Therapeutic Level Labs: No results found for: "LITHIUM" No results found for: "VALPROATE" No results found for: "CBMZ"  Current Medications: Current Outpatient Medications  Medication Sig Dispense Refill   ARIPiprazole (ABILIFY) 2 MG tablet Take 1 tablet (2 mg total) by mouth daily. 30 tablet 0   escitalopram (LEXAPRO) 5 MG tablet Take 1 tablet (5 mg total) by mouth daily. To be taken with Lexapro 10 mg daily. 30 tablet 0   albuterol (PROVENTIL HFA;VENTOLIN HFA) 108 (90 Base) MCG/ACT inhaler Inhale 2 puffs into the lungs every 6 (six) hours as needed.     escitalopram (LEXAPRO) 10 MG tablet Take 1 tablet (10 mg total) by mouth daily. 30 tablet 0   hydrOXYzine (ATARAX) 25 MG tablet Take 0.5-1 tablets(12.5-25 mg total) by mouth 3(three) times daily as needed for anxiety, and 1 tablet(25 mg total) at bedtime as needed for sleeping  difficulties. 30 tablet 0   medroxyPROGESTERone Acetate 150 MG/ML SUSY Inject 1 mL (150 mg total) into the muscle every 3 (three) months. 1 mL 0   nicotine polacrilex (NICORETTE) 2 MG gum Take 1 each (2 mg total) by mouth as needed for smoking cessation. 100 tablet 0   No current facility-administered medications for this visit.     Musculoskeletal: Strength & Muscle Tone: unable to assess since visit was over the telemedicine.  Gait & Station: unable to assess since visit was over the telemedicine.  Patient leans: N/A  Psychiatric Specialty Exam: ROSReview of 12 systems negative except as mentioned in HPI  There were no vitals taken for this visit.There is no height or weight on file to calculate BMI.  General Appearance: Casual  Eye Contact:  Good  Speech:  Clear and Coherent and Normal Rate  Volume:  Normal  Mood:  "good..."   Affect:  Appropriate, Congruent, and Restricted  Thought Process:  Goal Directed and Linear  Orientation:  Full (Time, Place, and Person)  Thought Content: Logical   Suicidal Thoughts:  No  Homicidal Thoughts:  No  Memory:  Immediate;   Good Recent;   Good Remote;   Good  Judgement:  Good  [facility]:  Shallow  Psychomotor Activity:  Normal  Concentration:  Concentration: Good and Attention Span: Good  Recall:  Good  Fund of Knowledge: Good  Language: Good  Akathisia:  No    AIMS (if indicated): not done  Assets:  Communication Skills Desire for Improvement Financial Resources/Insurance Housing Leisure Time Physical Health Social Support Transportation Vocational/Educational  ADL's:  Intact  Cognition: WNL  Sleep:   Good     Screenings: AUDIT    Flowsheet Row Admission (Discharged) from 11/25/2021 in [facility]PATIENT BE[facility] MEDICINE  Alcohol Use Disorder Identification Test Final Score (AUDIT) 1      PHQ2-9    Flowsheet Row Office Visit from 11/28/2021 in [facility]  PHQ-2 Total Score 3  PHQ-9  Total Score 13      Flowsheet Row Office Visit from 11/28/2021 in [facility] Admission (Discharged) from 11/25/2021 in [facility]PATIENT [facility]AL MEDICINE ED from  11/24/2021 [facility]  C-SSRS RISK CATEGORY Error: Q3, 4, or 5 should not be populated when Q2 is No Error: Q7 should not be populated when Q6 is No High Risk        Assessment and Plan:   This is an 21 year old Caucasian female, currently domiciled with her adoptive parents and twin sister, has psychiatric history significant of disruptive behavior disorder, anxiety and ADHD diagnosed at the age of 7 was not in any psychiatric treatment until 02/2018 referred by her PCP for psychiatric evaluation and med management in 02/2018.   Trialed Concerta - stopped due to loss of appetite and it not being effective, did not want to try anything else. Zoloft titrated up to 75 mg daily but self tapered due to feeling tired but was effective for anxiety. She was started on Prozac and has responded well but self discontinued due to nightsweats.    Plan reviewed on 02/19/2022 - She is following back after 1 week. Last week mother called due to concerns, recommended earlier appointment. She appeared to continue to have intermittent depressed mood, anhedonia, anxiety and occasional SI in the context of chronic psychosocial stressors.   I discussed recommendation to add abilify for mood stabilization and as an adjunctive treatment for depression. Discussed benefits, risks and side effects associated with it. Alternatively discussed to change Lexapro to Wellbutrin. They decided to discuss with father and asked for an earlier appointment today.   Today, pt reports intermittent mood fluctuations, however she is linear, has regular speech, not distractible, her affect appeared full, no delusions were elicited, did not note any impulsivity or irritability during the evaluation.   They  would like to try Abilify as discussed previously. Again discussed risks, benefits, side effects with pt and parent and they provided verbal informed consent. Pt is also taking Lexapro 10 mg and not 20 since the last three weeks, recommended her to increase back to atleast 15 mg daily and consider increasing to 20 mg at the next appointment.   Mother is advised to keep safety precautions at home including but not limited to locking medications including OTC meds, locking all the sharps and knives, increased supervision, locking up firearms which they did and pt does not have access to it, and call 911/or bring pt to ER for any safety concerns.     Also expresses concerns regarding concentration problems previously, has hx of ADHD, discussed treatment options previously, recommended to wait to stabilize her anxiety/mood, encouraged her to cut down on susbtance abuse. Will consider Straterra/Qelbree vs stimulant if she continues to have problems with attention and does not abuse cannabis.   # Anxiety (chronic, partially better)Depression (mild to moderate) - Continue Lexapro 20 mg daily at bedtime.Start Abilify 2 mg daily for mood stabilization - Therapy with Algis Downs - Every week @ [facility].   # irritability(chronic and notimproving) - Lexapro 15 mg daily. - Recommended abilify 2 mg daily.  - psychoeducation on cutting down on cannabis and nicotine.    # ADHD (chronic, unstable) - Trialed Concerta, stopped because pt did not find it effective and had low appetite.  - Consider trial of straterra/qelbree vs stimulant due to substance abuse once anxiety/mood stable.   # Nicotine and Cannabis abuse - Motivational interviewing, psychoeducation.   Lipid panel and HbA1c ordered, request sent. Also requested consent from pt to collaborate with therapist.  Follow up in 1 month or early if needed.   30 minutes total time for  encounter today which included chart review, pt evaluation, collaterals,  medication and other treatment discussions, medication orders and charting.            [name], MD 02/19/2022, 9:24 AM

## 2022-03-05 ENCOUNTER — Telehealth: Payer: BC Managed Care – PPO | Admitting: Child and Adolescent Psychiatry

## 2022-03-18 ENCOUNTER — Telehealth (INDEPENDENT_AMBULATORY_CARE_PROVIDER_SITE_OTHER): Payer: BC Managed Care – PPO | Admitting: Child and Adolescent Psychiatry

## 2022-03-18 DIAGNOSIS — F320 Major depressive disorder, single episode, mild: Secondary | ICD-10-CM | POA: Diagnosis not present

## 2022-03-18 DIAGNOSIS — F411 Generalized anxiety disorder: Secondary | ICD-10-CM

## 2022-03-18 MED ORDER — ARIPIPRAZOLE 5 MG PO TABS: 5.0000 mg | ORAL_TABLET | Freq: Every day | ORAL | 1 refills | Status: DC

## 2022-03-18 NOTE — Progress Notes (Signed)
Virtual Visit via Video Note  I connected with Nicole Branch on 03/18/22 at  8:30 AM EDT by a video enabled telemedicine application and verified that I am speaking with the correct person using two identifiers.  Location: Patient: home Provider: office   I discussed the limitations of evaluation and management by telemedicine and the availability of in person appointments. The patient expressed understanding and agreed to proceed.    I discussed the assessment and treatment plan with the patient. The patient was provided an opportunity to ask questions and all were answered. The patient agreed with the plan and demonstrated an understanding of the instructions.   The patient was advised to call back or seek an in-person evaluation if the symptoms worsen or if the condition fails to improve as anticipated.  I provided 30 minutes of non-face-to-face time during this encounter.   [name], MD   [facility] MD/PA/NP OP Progress Note  03/18/2022 1:05 PM AKYLAH Branch  MRN:  [id number]   Chief Complaint:   Medication management follow-up for mood, anxiety.  HPI: This is a 21 year old female with psychiatric history significant of generalized anxiety disorder, ADHD, MDD.  At her last appointment she was started on Abilify 10 mg once a day for mood instability and to augment Lexapro.    Today she was present by herself at her home and was evaluated alone. Appointment was attended by clinical observer Lovell Sheehan with pt's verbal informed consent to allow her to attend the appointment.   [name] reports that she is doing "good" however continues to have intermittent episodes of anger without any specific triggers.  She reports that this occurs about once every 3 days, can last up to a couple of hours, last time it occurred was last weekend and in the context of anger she scratched herself on her forearm with her nails.  She reports that she does not remember the reason why she had  scratched herself and did not even remember that she actually scratched herself.  She reports that in between these episodes that her mood is "good", denies any low lows, denies any SI or HI, denies problems with sleep, sleeps about 6 hours.  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  She reports that she believes her medication needs to be increased for mood stability.  I discussed with her to increase the dose of Abilify to 5 mg once a day while continuing Lexapro at 15 mg once a day.  She is also seeing her therapist and sees her about once every week, seems to have developed good therapeutic relationship with the therapist.  She denies any current SI or HI.  Her mother reports that patient continues to have intermittent behavioral outbursts when things does not go her way, and apparently she is a trigger for her.  Mother reports that in between these episodes she is fine like nothing really happened.  I also discussed with mother regarding medication adjustments.  Also discussed option of intensive outpatient treatment, patient denies the recommendations.  She also continues to vape nicotine every day, reports that she has cut down on marijuana to about once a day because of lack of money.  Recommended to work on decreasing the use of nicotine and marijuana as it may be contributing to her mood and anxiety however she is resistant.  They will continue to see her therapist and will  follow back again in a month.  They are yet to do blood work and will recently request.   Visit Diagnosis:    ICD-10-CM   1. Generalized anxiety disorder  F41.1 ARIPiprazole (ABILIFY) 5 MG tablet    2. Current mild episode of major depressive disorder without prior episode (HCC)  F32.0 ARIPiprazole (ABILIFY) 5 MG tablet         Past Psychiatric History: reviewed today from last visit and no change.  Past Medical History:  Past Medical  History:  Diagnosis Date   ADHD (attention deficit hyperactivity disorder)    Allergies    Anxiety 2019   Asthma 2013   GERD (gastroesophageal reflux disease) 2015   IBS (irritable bowel syndrome) 2021   [facility]   No past surgical history on file.  Family Psychiatric History: As mentioned in initial H&P, reviewed today, no change   Family History:  Family History  Adopted: Yes  Problem Relation Age of Onset   Drug abuse Mother    ADD / ADHD Sister     Social History:  Social History   Socioeconomic History   Marital status: Single    Spouse name: Not on file   Number of children: Not on file   Years of education: Not on file   Highest education level: Not on file  Occupational History   Not on file  Tobacco Use   Smoking status: Every Day    Packs/day: 0.25    Types: Cigarettes   Smokeless tobacco: Never  Vaping Use   Vaping Use: Every day   Substances: THC  Substance and Sexual Activity   Alcohol use: Not Currently    Comment: she does drink maybe a couple in a 2 week   Drug use: Yes    Types: Marijuana    Comment: last week dont want mother to know.    Sexual activity: Yes    Birth control/protection: Injection  Other Topics Concern   Not on file  Social History Narrative   Not on file   Social Determinants of Health   Financial Resource Strain: Not on file  Food Insecurity: Not on file  Transportation Needs: Not on file  Physical Activity: Sufficiently Active (03/10/2018)   Exercise Vital Sign    Days of Exercise per Week: 5 days    Minutes of Exercise per Session: 30 min  Stress: Stress Concern Present (03/10/2018)   [employer] of Occupational Health - Occupational Stress Questionnaire    Feeling of Stress : Very much  Social Connections: Unknown (03/10/2018)   Social Connection and Isolation Panel [NHANES]    Frequency of Communication with Friends and Family: Not on file    Frequency of Social Gatherings with Friends and Family: Not on  file    Attends Religious Services: More than 4 times per year    Active Member of [employer] or Organizations: Yes    Attends Engineer, structural: More than 4 times per year    Marital Status: Never married    Allergies: No Known Allergies  Metabolic Disorder Labs: No results found for: "HGBA1C", "MPG" No results found for: "PROLACTIN" No results found for: "CHOL", "TRIG", "HDL", "CHOLHDL", "VLDL", "LDLCALC" No results found for: "TSH"  Therapeutic Level Labs: No results found for: "LITHIUM" No results found for: "VALPROATE" No results found for: "CBMZ"  Current Medications: Current Outpatient Medications  Medication Sig Dispense Refill   albuterol (PROVENTIL HFA;VENTOLIN HFA) 108 (90 Base) MCG/ACT inhaler Inhale 2 puffs into the  lungs every 6 (six) hours as needed.     ARIPiprazole (ABILIFY) 5 MG tablet Take 1 tablet (5 mg total) by mouth daily. 30 tablet 1   escitalopram (LEXAPRO) 10 MG tablet Take 1 tablet (10 mg total) by mouth daily. 30 tablet 0   escitalopram (LEXAPRO) 5 MG tablet Take 1 tablet (5 mg total) by mouth daily. To be taken with Lexapro 10 mg daily. 30 tablet 0   hydrOXYzine (ATARAX) 25 MG tablet Take 0.5-1 tablets(12.5-25 mg total) by mouth 3(three) times daily as needed for anxiety, and 1 tablet(25 mg total) at bedtime as needed for sleeping difficulties. 30 tablet 0   medroxyPROGESTERone Acetate 150 MG/ML SUSY Inject 1 mL (150 mg total) into the muscle every 3 (three) months. 1 mL 0   nicotine polacrilex (NICORETTE) 2 MG gum Take 1 each (2 mg total) by mouth as needed for smoking cessation. 100 tablet 0   No current facility-administered medications for this visit.     Musculoskeletal: Strength & Muscle Tone: unable to assess since visit was over the telemedicine.  Gait & Station: unable to assess since visit was over the telemedicine.  Patient leans: N/A  Psychiatric Specialty Exam: ROSReview of 12 systems negative except as mentioned in HPI  There  were no vitals taken for this visit.There is no height or weight on file to calculate BMI.  General Appearance: Casual  Eye Contact:  Good  Speech:  Clear and Coherent and Normal Rate  Volume:  Normal  Mood:  "good.."   Affect:  Appropriate, Congruent, and Full Range  Thought Process:  Goal Directed and Linear  Orientation:  Full (Time, Place, and Person)  Thought Content: Logical   Suicidal Thoughts:  No  Homicidal Thoughts:  No  Memory:  Immediate;   Good Recent;   Good Remote;   Good  Judgement:  Good  [facility]:  Shallow  Psychomotor Activity:  Normal  Concentration:  Concentration: Good and Attention Span: Good  Recall:  Good  Fund of Knowledge: Good  Language: Good  Akathisia:  No    AIMS (if indicated): not done  Assets:  Communication Skills Desire for Improvement Financial Resources/Insurance Housing Leisure Time Physical Health Social Support Transportation Vocational/Educational  ADL's:  Intact  Cognition: WNL  Sleep:   Good     Screenings: AUDIT    Flowsheet Row Admission (Discharged) from 11/25/2021 in [facility]PATIENT BEH[facility]ICINE  Alcohol Use Disorder Identification Test Final Score (AUDIT) 1      PHQ2-9    Flowsheet Row Office Visit from 11/28/2021 in [facility]  PHQ-2 Total Score 3  PHQ-9 Total Score 13      Flowsheet Row Office Visit from 11/28/2021 in [facility] Admission (Discharged) from 11/25/2021 in [facility] INPATIENT BEHAVIORAL MEDICINE ED from 11/24/2021 in [facility]  C-SSRS RISK CATEGORY Error: Q3, 4, or 5 should not be populated when Q2 is No Error: Q7 should not be populated when Q6 is No High Risk        Assessment and Plan:   This is an 21 year old Caucasian female, currently domiciled with her adoptive parents and twin sister, has psychiatric history significant of disruptive behavior disorder, anxiety and ADHD diagnosed at the  age of 7 was not in any psychiatric treatment until 02/2018 referred by her PCP for psychiatric evaluation and med management in 02/2018.   Trialed Concerta - stopped due to loss of appetite and it not being effective, did not  want to try anything else. Zoloft titrated up to 75 mg daily but self tapered due to feeling tired but was effective for anxiety. She was started on Prozac and has responded well but self discontinued due to nightsweats.    Plan reviewed on 03/18/2022 - She is following back after 4 weeks.   Her depressive symptoms seems to be better, but she continues to struggle with intermittent mood dysregulation.  Today during the evaluation she appeared calm, cooperative, pleasant with bright and broad affect, no psychosis was evidenced.  Recommending to increase the dose of Abilify to 5 mg once a day. Today, pt reports intermittent mood fluctuations, however she is linear, has regular speech, not distractible,   Mother is advised to keep safety precautions at home including but not limited to locking medications including OTC meds, locking all the sharps and knives, increased supervision, locking up firearms which they did and pt does not have access to it, and call 911/or bring pt to ER for any safety concerns.     Also expresses concerns regarding concentration problems previously, has hx of ADHD, discussed treatment options previously, recommended to wait to stabilize her anxiety/mood, encouraged her to cut down on susbtance abuse. Will consider Straterra/Qelbree vs stimulant if she continues to have problems with attention and does not abuse cannabis.   # Anxiety (chronic, partially better)Depression (mild to moderate) - Continue Lexapro 15 mg daily at bedtime.Increase Abilify to 5 mg daily for mood stabilization - Therapy with Algis Downs - Every week @ [facility].   # irritability(chronic and notimproving) - Lexapro 15 mg daily. - Recommended abilify 5 mg daily.  -  psychoeducation on cutting down on cannabis and nicotine.    # ADHD (chronic, unstable) - Trialed Concerta, stopped because pt did not find it effective and had low appetite.  - Consider trial of straterra/qelbree vs stimulant due to substance abuse once anxiety/mood stable.   # Nicotine and Cannabis abuse - Motivational interviewing, psychoeducation.   Lipid panel and HbA1c ordered, request sent. Also requested consent from pt to collaborate with therapist.  Follow up in 1 month or early if needed.   30 minutes total time for encounter today which included chart review, pt evaluation, collaterals, medication and other treatment discussions, medication orders and charting.            [name], MD 03/18/2022, 1:05 PM

## 2022-03-19 ENCOUNTER — Other Ambulatory Visit: Payer: Self-pay | Admitting: Obstetrics and Gynecology

## 2022-03-19 DIAGNOSIS — Z3042 Encounter for surveillance of injectable contraceptive: Secondary | ICD-10-CM

## 2022-03-21 ENCOUNTER — Telehealth: Payer: Self-pay

## 2022-03-21 ENCOUNTER — Telehealth: Payer: Self-pay | Admitting: Child and Adolescent Psychiatry

## 2022-03-21 NOTE — Telephone Encounter (Signed)
Request was printed and give to [place].

## 2022-03-21 NOTE — Telephone Encounter (Signed)
Mother emailed regarding lab form that was to be emailed to her. She has not yet received. I did tell her to check her spam/junk mail also. Please advise

## 2022-03-27 ENCOUNTER — Telehealth: Payer: Self-pay

## 2022-03-27 NOTE — Telephone Encounter (Signed)
received note to email a copy of the lab work orders to [email or web address] .   email done

## 2022-04-29 ENCOUNTER — Telehealth: Payer: BC Managed Care – PPO | Admitting: Child and Adolescent Psychiatry

## 2022-05-04 NOTE — Telephone Encounter (Signed)
error 

## 2022-08-10 ENCOUNTER — Emergency Department
Admission: EM | Admit: 2022-08-10 | Discharge: 2022-08-11 | Disposition: A | Discharge status: Home / Self Care | Payer: BC Managed Care – PPO | Attending: Emergency Medicine | Admitting: Emergency Medicine

## 2022-08-10 ENCOUNTER — Other Ambulatory Visit: Payer: Self-pay

## 2022-08-10 DIAGNOSIS — J4531 Mild persistent asthma with (acute) exacerbation: Secondary | ICD-10-CM

## 2022-08-10 DIAGNOSIS — Z1152 Encounter for screening for COVID-19: Secondary | ICD-10-CM | POA: Insufficient documentation

## 2022-08-10 DIAGNOSIS — J069 Acute upper respiratory infection, unspecified: Secondary | ICD-10-CM | POA: Insufficient documentation

## 2022-08-10 DIAGNOSIS — K219 Gastro-esophageal reflux disease without esophagitis: Secondary | ICD-10-CM | POA: Insufficient documentation

## 2022-08-10 DIAGNOSIS — R0602 Shortness of breath: Secondary | ICD-10-CM | POA: Diagnosis present

## 2022-08-10 LAB — RESP PANEL BY RT-PCR (RSV, FLU A&B, COVID)  RVPGX2
Influenza A by PCR: NEGATIVE
Influenza B by PCR: NEGATIVE
Resp Syncytial Virus by PCR: NEGATIVE
SARS Coronavirus 2 by RT PCR: NEGATIVE

## 2022-08-10 LAB — GROUP A STREP BY PCR: Group A Strep by PCR: NOT DETECTED

## 2022-08-10 MED ORDER — ONDANSETRON 4 MG PO TBDP
4.0000 mg | ORAL_TABLET | Freq: Once | ORAL | Status: AC
  Administered 2022-08-10: 4 mg via ORAL
  Filled 2022-08-10: qty 1

## 2022-08-10 MED ORDER — PREDNISONE 20 MG PO TABS
60.0000 mg | ORAL_TABLET | ORAL | Status: AC
  Administered 2022-08-10: 60 mg via ORAL
  Filled 2022-08-10: qty 3

## 2022-08-10 MED ORDER — ALBUTEROL SULFATE HFA 108 (90 BASE) MCG/ACT IN AERS: INHALATION_SPRAY | RESPIRATORY_TRACT | 0 refills | Status: DC

## 2022-08-10 MED ORDER — PREDNISONE 10 MG PO TABS: ORAL_TABLET | ORAL | 0 refills | Status: DC

## 2022-08-10 MED ORDER — ALBUTEROL SULFATE (2.5 MG/3ML) 0.083% IN NEBU: 2.5000 mg | INHALATION_SOLUTION | RESPIRATORY_TRACT | 2 refills | Status: DC | PRN

## 2022-08-10 MED ORDER — ALUM & MAG HYDROXIDE-SIMETH 200-200-20 MG/5ML PO SUSP
30.0000 mL | Freq: Once | ORAL | Status: AC
  Administered 2022-08-10: 30 mL via ORAL
  Filled 2022-08-10: qty 30

## 2022-08-10 NOTE — ED Provider Notes (Signed)
[facility] Provider Note    Event Date/Time   First MD Initiated Contact with Patient 08/10/22 2317     (approximate)   History   Shortness of Breath   HPI  Nicole Branch is a 22 y.o. female who presents for evaluation of about 3 days of increased shortness of breath, wheezing, cough, and nasal congestion.  She has a history of asthma that is generally well-controlled but is worsened by cold weather, which we have been having recently.  Symptoms are worse with exertion.  She had a subjective fever yesterday but no measured fever.  No nausea, vomiting, abdominal pain.  Some chest tightness when she is breathing.  She says she has a nebulizer at home and has some treatment still but she would like a refill if possible.  She also has an inhaler but would like a prescription for another 1.  She has not been on steroids recently.     Physical Exam   Triage Vital Signs: ED Triage Vitals  Enc Vitals Group     BP 08/10/22 1944 116/75     Pulse Rate 08/10/22 1944 91     Resp 08/10/22 1944 18     Temp 08/10/22 1944 97.8 F (36.6 C)     Temp Source 08/10/22 1944 Oral     SpO2 08/10/22 1943 99 %     Weight 08/10/22 1947 61.2 kg (135 lb)     Height 08/10/22 1947 1.6 m (5\' 3" )     Head Circumference --      Peak Flow --      Pain Score 08/10/22 1947 3     Pain Loc --      Pain Edu? --      Excl. in [place]? --     Most recent vital signs: Vitals:   08/10/22 1944 08/10/22 2329  BP: 116/75 121/74  Pulse: 91 88  Resp: 18 18  Temp: 97.8 F (36.6 C) 98.2 F (36.8 C)  SpO2: 94% 96%     General: Awake, no respiratory distress while at rest. CV:  Good peripheral perfusion.  Normal heart sounds.  Regular rate and rhythm. Resp:  Normal effort.  No accessory muscle usage or intercostal retractions.  However she has substantial expiratory wheezing bilaterally.  She is able to speak easily and clearly.  Abd:  No distention.    ED Results / Procedures /  Treatments   Labs (all labs ordered are listed, but only abnormal results are displayed) Labs Reviewed  GROUP A STREP BY PCR  RESP PANEL BY RT-PCR (RSV, FLU A&B, COVID)  [id number]       PROCEDURES:  Critical Care performed: No  Procedures   MEDICATIONS ORDERED IN ED: Medications  ondansetron (ZOFRAN-ODT) disintegrating tablet 4 mg (4 mg Oral Given 08/10/22 2217)  predniSONE (DELTASONE) tablet 60 mg (60 mg Oral Given 08/10/22 2333)  alum & mag hydroxide-simeth (MAALOX/MYLANTA) 200-200-20 MG/5ML suspension 30 mL (30 mLs Oral Given 08/10/22 2333)     IMPRESSION / MDM / ASSESSMENT AND PLAN / ED COURSE  I reviewed the triage vital signs and the nursing notes.                              Differential diagnosis includes, but is not limited to, asthma exacerbation, viral URI, pneumonia, specific viral infection such as COVID or influenza.  Patient's presentation is most consistent with exacerbation of chronic illness.  Lab/studies ordered: Respiratory viral panel, group A strep.  Vital signs are stable and within normal limits.  Patient appears comfortable.  Upon auscultation, she has substantial expiratory wheezing consistent with asthma exacerbation.  She is not in distress although I am certain that her symptoms are worse with exertion.  Her SpO2 is appropriate and she is not using accessory muscles.  Given expiratory wheezing, I offered DuoNebs in the ED, but she says she has medicine at home.  She is mostly concerned about making sure she has enough medicine and whether or not she had a specific viral infection.  Respiratory viral panel is negative and group A strep test is negative.  I suspect that she has a nonspecific viral illness that is exacerbating her asthma.  I ordered prednisone 60 mg and Maalox 30 mL because she said that she has acid reflux currently with a burning sensation in her throat.  She is comfortable with the plan for discharge and outpatient follow-up with  the prescriptions as listed below.  I gave my usual and customary asthma return precautions.        FINAL CLINICAL IMPRESSION(S) / ED DIAGNOSES   Final diagnoses:  Mild persistent asthma with exacerbation  Gastroesophageal reflux disease, unspecified whether esophagitis present  Viral URI with cough     Rx / DC Orders   ED Discharge Orders          Ordered    albuterol (VENTOLIN HFA) 108 (90 Base) MCG/ACT inhaler       Note to Pharmacy: Pharmacy may substitute brand and size for insurance-approved equivalent   08/10/22 2349    albuterol (PROVENTIL) (2.5 MG/3ML) 0.083% nebulizer solution  Every 4 hours PRN        08/10/22 2349    predniSONE (DELTASONE) 10 MG tablet        08/10/22 2349             Note:  This document was prepared using Dragon voice recognition software and may include unintentional dictation errors.   Hinda Kehr, MD 08/10/22 2350

## 2022-08-10 NOTE — ED Triage Notes (Signed)
Shortness of breath for the last 3 days, has a stuffy nose as well.

## 2022-08-10 NOTE — Discharge Instructions (Addendum)
As we discussed, we believe you have a mild viral illness (a cold) that is exacerbating your asthma.  Please continue using your regular medications.  We wrote refills for the inhaler and the solution for your nebulizer.  We wrote a prescription for prednisone that you should take according to the prescription instructions until it is gone.  You can use an over-the-counter antacid medicine if you are having trouble with your reflux (Maalox, famotidine, omeprazole, etc.).  We provided information about local primary care clinics that are taking new patients.  We recommend that you call one of them and establish a primary care provider.    Return to the emergency department if you develop new or worsening symptoms that concern you.

## 2022-08-18 NOTE — Progress Notes (Unsigned)
PCP:  Pcp, No   No chief complaint on file.    HPI:      Ms. Nicole Branch is a 22 y.o. G0P0000 whose LMP was No LMP recorded. Patient has had an injection., presents today for her annual examination.  Her menses are absent due to depo. No BTB, no dysmen. Doing well, wants to continue  Sex activity: single partner, contraception - Depo-Provera injections.  Last Pap: N/A due to age Hx of STDs: none  There is no FH of breast cancer. There is no FH of ovarian cancer. The patient does not do self-breast exams.  Tobacco use: vapes daily Alcohol use: none No drug use.  Exercise: moderately active  She does not get adequate calcium and Vitamin D in her diet.  Gardasil with PCP    Past Medical History:  Diagnosis Date   ADHD (attention deficit hyperactivity disorder)    Allergies    Anxiety 2019   Asthma 2013   GERD (gastroesophageal reflux disease) 2015   IBS (irritable bowel syndrome) 2021   [facility]    No past surgical history on file.  Family History  Adopted: Yes  Problem Relation Age of Onset   Drug abuse Mother    ADD / ADHD Sister     Social History   Socioeconomic History   Marital status: Single    Spouse name: Not on file   Number of children: Not on file   Years of education: Not on file   Highest education level: Not on file  Occupational History   Not on file  Tobacco Use   Smoking status: Every Day    Packs/day: 0.25    Types: Cigarettes   Smokeless tobacco: Never  Vaping Use   Vaping Use: Every day   Substances: THC  Substance and Sexual Activity   Alcohol use: Not Currently    Comment: she does drink maybe a couple in a 2 week   Drug use: Yes    Types: Marijuana    Comment: last week dont want mother to know.    Sexual activity: Yes    Birth control/protection: Injection  Other Topics Concern   Not on file  Social History Narrative   Not on file   Social Determinants of Health   Financial Resource Strain: Not on file   Food Insecurity: Not on file  Transportation Needs: Not on file  Physical Activity: Sufficiently Active (03/10/2018)   Exercise Vital Sign    Days of Exercise per Week: 5 days    Minutes of Exercise per Session: 30 min  Stress: Stress Concern Present (03/10/2018)   [employer] of Occupational Health - Occupational Stress Questionnaire    Feeling of Stress : Very much  Social Connections: Unknown (03/10/2018)   Social Connection and Isolation Panel [NHANES]    Frequency of Communication with Friends and Family: Not on file    Frequency of Social Gatherings with Friends and Family: Not on file    Attends Religious Services: More than 4 times per year    Active Member of [employer] or Organizations: Yes    Attends Banker Meetings: More than 4 times per year    Marital Status: Never married  Intimate Partner Violence: Not At Risk (03/10/2018)   Humiliation, Afraid, Rape, and Kick questionnaire    Fear of Current or Ex-Partner: No    Emotionally Abused: No    Physically Abused: No    Sexually Abused: No  Current Outpatient Medications:    albuterol (PROVENTIL) (2.5 MG/3ML) 0.083% nebulizer solution, Take 3 mLs (2.5 mg total) by nebulization every 4 (four) hours as needed for wheezing or shortness of breath., Disp: 75 mL, Rfl: 2   albuterol (VENTOLIN HFA) 108 (90 Base) MCG/ACT inhaler, Inhale 2-4 puffs by mouth every 4 hours as needed for wheezing, cough, and/or shortness of breath, Disp: 6.7 g, Rfl: 0   ARIPiprazole (ABILIFY) 5 MG tablet, Take 1 tablet (5 mg total) by mouth daily., Disp: 30 tablet, Rfl: 1   escitalopram (LEXAPRO) 10 MG tablet, Take 1 tablet (10 mg total) by mouth daily., Disp: 30 tablet, Rfl: 0   escitalopram (LEXAPRO) 5 MG tablet, Take 1 tablet (5 mg total) by mouth daily. To be taken with Lexapro 10 mg daily., Disp: 30 tablet, Rfl: 0   hydrOXYzine (ATARAX) 25 MG tablet, Take 0.5-1 tablets(12.5-25 mg total) by mouth 3(three) times daily as needed for  anxiety, and 1 tablet(25 mg total) at bedtime as needed for sleeping difficulties., Disp: 30 tablet, Rfl: 0   medroxyPROGESTERone Acetate 150 MG/ML SUSY, Inject 1 mL (150 mg total) into the muscle every 3 (three) months., Disp: 1 mL, Rfl: 0   nicotine polacrilex (NICORETTE) 2 MG gum, Take 1 each (2 mg total) by mouth as needed for smoking cessation., Disp: 100 tablet, Rfl: 0   predniSONE (DELTASONE) 10 MG tablet, Take 4 tabs (40 mg) PO x 3 days, then take 2 tabs (20 mg) PO x 3 days, then take 1 tab (10 mg) PO x 3 days, then take 1/2 tab (5 mg) PO x 4 days., Disp: 23 tablet, Rfl: 0     ROS:  Review of Systems  Constitutional:  Negative for fatigue, fever and unexpected weight change.  Respiratory:  Negative for cough, shortness of breath and wheezing.   Cardiovascular:  Negative for chest pain, palpitations and leg swelling.  Gastrointestinal:  Negative for blood in stool, constipation, diarrhea, nausea and vomiting.  Endocrine: Negative for cold intolerance, heat intolerance and polyuria.  Genitourinary:  Negative for dyspareunia, dysuria, flank pain, frequency, genital sores, hematuria, menstrual problem, pelvic pain, urgency, vaginal bleeding, vaginal discharge and vaginal pain.  Musculoskeletal:  Negative for back pain, joint swelling and myalgias.  Skin:  Negative for rash.  Neurological:  Negative for dizziness, syncope, light-headedness, numbness and headaches.  Hematological:  Negative for adenopathy.  Psychiatric/Behavioral:  Negative for agitation, confusion, sleep disturbance and suicidal ideas. The patient is not nervous/anxious.   BREAST: No symptoms   Objective: There were no vitals taken for this visit.   Physical Exam Constitutional:      Appearance: She is well-developed.  Genitourinary:     Vulva normal.     Right Labia: No rash, tenderness or lesions.    Left Labia: No tenderness, lesions or rash.    Vaginal bleeding present.     No vaginal discharge, erythema  or tenderness.      Right Adnexa: not tender and no mass present.    Left Adnexa: not tender and no mass present.    No cervical friability or polyp.     Uterus is not enlarged or tender.  Breasts:    Right: No mass, nipple discharge, skin change or tenderness.     Left: No mass, nipple discharge, skin change or tenderness.  Neck:     Thyroid: No thyromegaly.  Cardiovascular:     Rate and Rhythm: Normal rate and regular rhythm.     Heart sounds: Normal heart  sounds. No murmur heard. Pulmonary:     Effort: Pulmonary effort is normal.     Breath sounds: Normal breath sounds.  Abdominal:     Palpations: Abdomen is soft.     Tenderness: There is no abdominal tenderness. There is no guarding or rebound.  Musculoskeletal:        General: Normal range of motion.     Cervical back: Normal range of motion.  Lymphadenopathy:     Cervical: No cervical adenopathy.  Neurological:     General: No focal deficit present.     Mental Status: She is alert and oriented to person, place, and time.     Cranial Nerves: No cranial nerve deficit.  Skin:    General: Skin is warm and dry.  Psychiatric:        Mood and Affect: Mood normal.        Behavior: Behavior normal.        Thought Content: Thought content normal.        Judgment: Judgment normal.  Vitals reviewed.    Assessment/Plan: Encounter for annual routine gynecological examination  Screening for STD (sexually transmitted disease) - Plan: Cervicovaginal ancillary only  Encounter for surveillance of injectable contraceptive - Plan: medroxyPROGESTERone Acetate 150 MG/ML SUSY; depo today with Rx RF. Increase ca/Vit D with supp.  No orders of the defined types were placed in this encounter.            GYN counsel adequate intake of calcium and vitamin D, diet and exercise     F/U  No follow-ups on file.  Phoua Hoadley B. Slayden Mennenga, PA-C 08/18/2022 6:57 PM

## 2022-08-19 ENCOUNTER — Ambulatory Visit (INDEPENDENT_AMBULATORY_CARE_PROVIDER_SITE_OTHER): Payer: BC Managed Care – PPO | Admitting: Obstetrics and Gynecology

## 2022-08-19 ENCOUNTER — Encounter: Payer: Self-pay | Admitting: Obstetrics and Gynecology

## 2022-08-19 ENCOUNTER — Other Ambulatory Visit (HOSPITAL_COMMUNITY)
Admission: RE | Admit: 2022-08-19 | Discharge: 2022-08-19 | Disposition: A | Discharge status: Home / Self Care | Payer: BC Managed Care – PPO | Source: Ambulatory Visit | Attending: Obstetrics and Gynecology | Admitting: Obstetrics and Gynecology

## 2022-08-19 VITALS — BP 100/70 | Ht 63.0 in | Wt 133.0 lb

## 2022-08-19 DIAGNOSIS — Z113 Encounter for screening for infections with a predominantly sexual mode of transmission: Secondary | ICD-10-CM

## 2022-08-19 DIAGNOSIS — Z01419 Encounter for gynecological examination (general) (routine) without abnormal findings: Secondary | ICD-10-CM | POA: Diagnosis not present

## 2022-08-19 DIAGNOSIS — Z3042 Encounter for surveillance of injectable contraceptive: Secondary | ICD-10-CM

## 2022-08-19 DIAGNOSIS — Z30013 Encounter for initial prescription of injectable contraceptive: Secondary | ICD-10-CM

## 2022-08-19 MED ORDER — MEDROXYPROGESTERONE ACETATE 150 MG/ML IM SUSP
150.0000 mg | INTRAMUSCULAR | Status: AC
  Administered 2022-08-19: 150 mg via INTRAMUSCULAR

## 2022-08-19 NOTE — Patient Instructions (Signed)
I value your feedback and you entrusting us with your care. If you get a [facility] patient survey, I would appreciate you taking the time to let us know about your experience today. Thank you! ? ? ?

## 2022-08-21 LAB — CERVICOVAGINAL ANCILLARY ONLY
Chlamydia: NEGATIVE
Comment: NEGATIVE
Comment: NORMAL
Neisseria Gonorrhea: NEGATIVE

## 2022-11-11 ENCOUNTER — Ambulatory Visit (INDEPENDENT_AMBULATORY_CARE_PROVIDER_SITE_OTHER): Payer: BC Managed Care – PPO

## 2022-11-11 VITALS — BP 93/58 | HR 79 | Ht 63.0 in | Wt 131.0 lb

## 2022-11-11 DIAGNOSIS — Z3042 Encounter for surveillance of injectable contraceptive: Secondary | ICD-10-CM

## 2022-11-11 MED ORDER — MEDROXYPROGESTERONE ACETATE 150 MG/ML IM SUSY
150.0000 mg | PREFILLED_SYRINGE | Freq: Once | INTRAMUSCULAR | Status: AC
  Administered 2022-11-11: 150 mg via INTRAMUSCULAR

## 2022-11-11 NOTE — Progress Notes (Signed)
    NURSE VISIT NOTE  Subjective:    Patient ID: Nicole Branch, female    DOB: 11/29/2000, 22 y.o.   MRN: [id number]  HPI  Patient is a 22 y.o. G70P0000 female who presents for depo provera injection.   Objective:    BP (!) 93/58   Pulse 79   Ht  (1.6 m)   Wt 131 lb (59.4 kg)   BMI 23.21 kg/m   Last Annual: 08/19/22. Last pap: N/A. Last Depo-Provera: 08/19/22. Side Effects if any: none. Serum HCG indicated? No . Depo-Provera 150 mg IM given by: Donnetta Hail, CMA. Site: Right Upper [place]er [place]   Assessment:   1. Encounter for surveillance of injectable contraceptive      Plan:   Next appointment due between July 8 and July 22.    Donnetta Hail, CMA

## 2022-11-11 NOTE — Patient Instructions (Signed)

## 2022-12-23 ENCOUNTER — Ambulatory Visit (INDEPENDENT_AMBULATORY_CARE_PROVIDER_SITE_OTHER): Payer: BC Managed Care – PPO

## 2022-12-23 VITALS — BP 116/81 | HR 107 | Resp 16 | Ht 63.0 in | Wt 129.2 lb

## 2022-12-23 DIAGNOSIS — N912 Amenorrhea, unspecified: Secondary | ICD-10-CM | POA: Diagnosis not present

## 2022-12-23 DIAGNOSIS — Z3202 Encounter for pregnancy test, result negative: Secondary | ICD-10-CM

## 2022-12-23 LAB — POCT URINE PREGNANCY: Preg Test, Ur: NEGATIVE

## 2022-12-23 NOTE — Patient Instructions (Signed)

## 2022-12-23 NOTE — Progress Notes (Signed)
    NURSE VISIT NOTE  Subjective:    Patient ID: Nicole Branch, female    DOB: 11/11/2000, 22 y.o.   MRN: [id number]  HPI  Patient is a 22 y.o. G0P0000 female who presents for evaluation of amenorrhea. She believes she could be pregnant. Pregnancy is not desired. Sexual Activity: single partner, contraception: Depo-Provera injections. Current symptoms also include: breast tenderness, morning sickness, nausea, and positive home pregnancy test. Last period was unknown. She is actively bleeding now. She said she had 1 positive home pregnancy test. She is currently on Depo Provera and is due for her next injection in July.    Objective:    BP 116/81   Pulse (!) 107   Resp 16   Ht 5\' 3"  (1.6 m)   Wt 129 lb 3.2 oz (58.6 kg)   LMP  (LMP Unknown)   BMI 22.89 kg/m   Lab Review  Results for orders placed or performed in visit on 12/23/22  POCT urine pregnancy  Result Value Ref Range   Preg Test, Ur Negative Negative    Assessment:   1. Amenorrhea     Plan:   Pregnancy Test: @PLANEND @Negative   Patient will check my chart for results. RTC for scheduled Depo Injection that is due on July 8 and July 22.   Santiago Bumpers, CMA [facility]

## 2023-02-03 ENCOUNTER — Ambulatory Visit: Payer: BC Managed Care – PPO

## 2023-02-03 NOTE — Progress Notes (Deleted)
    NURSE VISIT NOTE  Subjective:    Patient ID: Nicole Branch, female    DOB: [birth year], 21 y.o.   MRN: [id number]  HPI  Patient is a 22 y.o. G55P0000 female who presents for depo provera injection.   Objective:    There were no vitals taken for this visit.  Last Annual: 07/30/22. Last pap: n/a. Last Depo-Provera: 11/11/22. Side Effects if any: none***. Serum HCG indicated? No . Depo-Provera 150 mg IM given by: Cornelius Moras, CMA. Site: {AOB INJ [id number]  Lab Review  @THIS  VISIT ONLY@  Assessment:   No diagnosis found.   Plan:   Next appointment due between 04/21/23 and 05/05/23.    Cornelius Moras, CMA

## 2023-02-06 ENCOUNTER — Ambulatory Visit: Payer: BC Managed Care – PPO | Admitting: Obstetrics and Gynecology

## 2023-02-06 NOTE — Telephone Encounter (Signed)
Spoke to pt. She would like to stay on depo but she wants the current bleeding to stop by 02/21/23 due to a trip she has. She is interested in adding estrogen but needs this bleeding to be gone by 02/21/23. She thought she had cancelled today's appt, advised her she was still on schedule. Will let FD know to cancel today's appt per pt.

## 2023-02-06 NOTE — Telephone Encounter (Signed)
Pt needs to schedule depo appt. I can't guarantee when bleeding will stop, but the sooner she gets her depo, the faster it should resolve.

## 2023-02-06 NOTE — Telephone Encounter (Signed)
Pt aware. Transferred to April to schedule depo.

## 2023-02-10 ENCOUNTER — Ambulatory Visit (INDEPENDENT_AMBULATORY_CARE_PROVIDER_SITE_OTHER): Payer: BC Managed Care – PPO

## 2023-02-10 VITALS — BP 112/72 | HR 102 | Ht 63.0 in | Wt 126.8 lb

## 2023-02-10 DIAGNOSIS — Z3042 Encounter for surveillance of injectable contraceptive: Secondary | ICD-10-CM

## 2023-02-10 MED ORDER — MEDROXYPROGESTERONE ACETATE 150 MG/ML IM SUSP
150.0000 mg | Freq: Once | INTRAMUSCULAR | Status: AC
  Administered 2023-02-10: 150 mg via INTRAMUSCULAR

## 2023-02-10 NOTE — Progress Notes (Signed)
    NURSE VISIT NOTE  Subjective:    Patient ID: Nicole Branch, female    DOB: 2000-10-23, 22 y.o.   MRN: [id number]  HPI  Patient is a 22 y.o. G49P0000 female who presents for depo provera injection.   Objective:    BP 112/72   Pulse (!) 102   Ht 5\' 3"  (1.6 m)   Wt 126 lb 12.8 oz (57.5 kg)   BMI 22.46 kg/m   Last Annual: 08/19/22. Last pap: n/a due to age. Last Depo-Provera: 11/11/22. Side Effects if any: n/a. Serum HCG indicated? No . Depo-Provera 150 mg IM given by: Georgiana Shore, CMA. Site: Left Upper [place]er [place]    Assessment:   1. Encounter for management and injection of depo-Provera      Plan:   Next appointment due between 04/28/23 and 05/12/23.    Loman Chroman, CMA

## 2023-05-08 ENCOUNTER — Ambulatory Visit: Payer: BC Managed Care – PPO

## 2023-05-08 NOTE — Progress Notes (Deleted)
    NURSE VISIT NOTE  Subjective:    Patient ID: TOMEKA KANTNER, female    DOB: 02/07/2001, 22 y.o.   MRN: [id number]  HPI  Patient is a 22 y.o. G0P0000 female who presents for depo provera injection.   Objective:    There were no vitals taken for this visit.  Last Annual: 02/10/23. Last pap: N/A. Last Depo-Provera: 02/10/23. Side Effects if any: none. Serum HCG indicated? No . Depo-Provera 150 mg IM given by: Sheliah Hatch, CMA. Site: Right Upper [place]er [place]  Lab Review    Assessment:   No diagnosis found.   Plan:   Next appointment due between 07/24/23 and 08/07/23.    Fonda Kinder, CMA

## 2023-06-02 ENCOUNTER — Ambulatory Visit (INDEPENDENT_AMBULATORY_CARE_PROVIDER_SITE_OTHER): Payer: BC Managed Care – PPO

## 2023-06-02 VITALS — BP 124/85 | HR 86 | Ht 63.0 in | Wt 136.1 lb

## 2023-06-02 DIAGNOSIS — N912 Amenorrhea, unspecified: Secondary | ICD-10-CM | POA: Diagnosis not present

## 2023-06-02 NOTE — Progress Notes (Signed)
   GYN ENCOUNTER  Encounter for pregnancy confirmation.  Subjective  HPI: Nicole Branch is a 22 y.o. G0P0000 who presents today for evaluation of amenorrhea.  Was previously on Depo Provera for 5 years, last shot received 7/22. Has not had cycle since that time. Positive urine pregnancy test at home. Pregnancy is surprise but desired.   On further exploration of positive home pregnancy test, the patient stated that after 10 minutes it was negative but then she looked at it the next day and saw a faint line.   Past Medical History:  Diagnosis Date   ADHD (attention deficit hyperactivity disorder)    Allergies    Anxiety 2019   Asthma 2013   GERD (gastroesophageal reflux disease) 2015   IBS (irritable bowel syndrome) 2021   [facility]   Past Surgical History:  Procedure Laterality Date   NO PAST SURGERIES     OB History     Gravida  0   Para  0   Term  0   Preterm  0   AB  0   Living  0      SAB  0   IAB  0   Ectopic  0   Multiple  0   Live Births  0          Allergies  Allergen Reactions   Amoxicillin Hives and Rash    Review of Systems  12 point ROS negative except for pertinent positives noted in HPO above.   Objective  BP 124/85   Pulse 86   Ht 5\' 3"  (1.6 m)   Wt 136 lb 1.6 oz (61.7 kg)   BMI 24.11 kg/m   Physical examination GENERAL APPEARANCE: alert, well appearing LUNGS: normal work of breathing HEART: normal heart rate  Assessment/Plan - Negative UPT in clinic today. Reviewed that home pregnancy test should not be read outside of recommended timeframe for accurate results.  - Discussed length of time for return to fertility after discontinuing Depo Provera could be up to 15 months. - Encouraged daily vitamin with 400 mcg folic acid if trying to conceive.   Nicole Branch, CNM  06/02/23 4:25 PM

## 2023-12-18 ENCOUNTER — Ambulatory Visit (INDEPENDENT_AMBULATORY_CARE_PROVIDER_SITE_OTHER)

## 2023-12-18 VITALS — BP 124/85 | HR 86 | Ht 63.0 in | Wt 144.1 lb

## 2023-12-18 DIAGNOSIS — Z3201 Encounter for pregnancy test, result positive: Secondary | ICD-10-CM

## 2023-12-18 DIAGNOSIS — Z3200 Encounter for pregnancy test, result unknown: Secondary | ICD-10-CM

## 2023-12-18 DIAGNOSIS — N912 Amenorrhea, unspecified: Secondary | ICD-10-CM

## 2023-12-18 DIAGNOSIS — Z3401 Encounter for supervision of normal first pregnancy, first trimester: Secondary | ICD-10-CM

## 2023-12-18 LAB — POCT URINE PREGNANCY: Preg Test, Ur: POSITIVE — AB

## 2023-12-18 NOTE — Progress Notes (Signed)
    NURSE VISIT NOTE  Subjective:    Patient ID: Nicole Branch, female    DOB: 2000/08/15, 23 y.o.   MRN: [id number]  HPI  Patient is a 23 y.o. G20P0000 female who presents for evaluation of amenorrhea. She believes she could be pregnant. Pregnancy is desired. Sexual Activity: has sex with males. Current symptoms also include: positive home pregnancy test. Last period was normal.  Patient has appointment with her psychiatrist today to discuss which medication she should stop while pregnant.    Objective:    BP 124/85   Pulse 86   Ht 5\' 3"  (1.6 m)   Wt 144 lb 1.6 oz (65.4 kg)   LMP 11/14/2023 (Exact Date)   BMI 25.53 kg/m   Lab Review  Results for orders placed or performed in visit on 12/18/23  POCT urine pregnancy  Result Value Ref Range   Preg Test, Ur Positive (A) Negative    Assessment:   1. Encounter for pregnancy test, result unknown     Plan:   Pregnancy Test: Positive  Estimated Date of Delivery: 08/20/2024 Encouraged well-balanced diet, plenty of rest when needed, pre-natal vitamins daily and walking for exercise.  Discussed self-help for nausea, avoiding OTC medications until consulting provider or pharmacist, other than Tylenol  as needed, minimal caffeine (1-2 cups daily) and avoiding alcohol.   She will schedule her nurse visit @ 7-[redacted] wks pregnant, u/s for dating @10  wk, and NOB visit at [redacted] wk pregnant.    Feel free to call with any questions.     Juanita Norlander, RN

## 2023-12-18 NOTE — Addendum Note (Signed)
 Addended by: Juanita Norlander on: 12/18/2023 02:31 PM   Modules accepted: Orders

## 2023-12-25 ENCOUNTER — Emergency Department

## 2023-12-25 ENCOUNTER — Other Ambulatory Visit: Payer: Self-pay

## 2023-12-25 ENCOUNTER — Telehealth: Payer: Self-pay

## 2023-12-25 ENCOUNTER — Emergency Department: Admission: EM | Admit: 2023-12-25 | Discharge: 2023-12-25 | Disposition: A | Discharge status: Home / Self Care

## 2023-12-25 DIAGNOSIS — O039 Complete or unspecified spontaneous abortion without complication: Secondary | ICD-10-CM

## 2023-12-25 DIAGNOSIS — O209 Hemorrhage in early pregnancy, unspecified: Secondary | ICD-10-CM | POA: Insufficient documentation

## 2023-12-25 DIAGNOSIS — R8271 Bacteriuria: Secondary | ICD-10-CM | POA: Insufficient documentation

## 2023-12-25 DIAGNOSIS — O4690 Antepartum hemorrhage, unspecified, unspecified trimester: Secondary | ICD-10-CM

## 2023-12-25 DIAGNOSIS — J45909 Unspecified asthma, uncomplicated: Secondary | ICD-10-CM | POA: Insufficient documentation

## 2023-12-25 DIAGNOSIS — Z3A01 Less than 8 weeks gestation of pregnancy: Secondary | ICD-10-CM | POA: Diagnosis not present

## 2023-12-25 DIAGNOSIS — O99891 Other specified diseases and conditions complicating pregnancy: Secondary | ICD-10-CM | POA: Insufficient documentation

## 2023-12-25 LAB — URINALYSIS, ROUTINE W REFLEX MICROSCOPIC
Bilirubin Urine: NEGATIVE
Glucose, UA: NEGATIVE mg/dL
Ketones, ur: 80 mg/dL — AB
Leukocytes,Ua: NEGATIVE
Nitrite: NEGATIVE
Protein, ur: NEGATIVE mg/dL
Specific Gravity, Urine: 1.025 (ref 1.005–1.030)
pH: 5 (ref 5.0–8.0)

## 2023-12-25 LAB — ABO/RH: ABO/RH(D): O POS

## 2023-12-25 LAB — CBC
HCT: 38 % (ref 36.0–46.0)
Hemoglobin: 12.9 g/dL (ref 12.0–15.0)
MCH: 27 pg (ref 26.0–34.0)
MCHC: 33.9 g/dL (ref 30.0–36.0)
MCV: 79.5 fL — ABNORMAL LOW (ref 80.0–100.0)
Platelets: 307 10*3/uL (ref 150–400)
RBC: 4.78 MIL/uL (ref 3.87–5.11)
RDW: 12.3 % (ref 11.5–15.5)
WBC: 9.7 10*3/uL (ref 4.0–10.5)
nRBC: 0 % (ref 0.0–0.2)

## 2023-12-25 LAB — BASIC METABOLIC PANEL WITH GFR
Anion gap: 10 (ref 5–15)
BUN: 10 mg/dL (ref 6–20)
CO2: 21 mmol/L — ABNORMAL LOW (ref 22–32)
Calcium: 9.1 mg/dL (ref 8.9–10.3)
Chloride: 104 mmol/L (ref 98–111)
Creatinine, Ser: 0.71 mg/dL (ref 0.44–1.00)
GFR, Estimated: >60 mL/min (ref >60)
Glucose, Bld: 83 mg/dL (ref 70–99)
Potassium: 4.1 mmol/L (ref 3.5–5.1)
Sodium: 135 mmol/L (ref 135–145)

## 2023-12-25 LAB — HCG, QUANTITATIVE, PREGNANCY: hCG, Beta Chain, Quant, S: 58911 m[IU]/mL — ABNORMAL HIGH (ref <5)

## 2023-12-25 LAB — POC URINE PREG, ED: Preg Test, Ur: POSITIVE — AB

## 2023-12-25 MED ORDER — CEPHALEXIN 500 MG PO CAPS: 500.0000 mg | ORAL_CAPSULE | Freq: Four times a day (QID) | ORAL | 0 refills | Status: AC

## 2023-12-25 NOTE — Telephone Encounter (Signed)
 Pt called triage reports bleeding and dry heaving. I advised her to go to the ER so she could get an ultrasound so see if this was a miscarriage. She reports no recent intercourse. Pt understanding,

## 2023-12-25 NOTE — ED Provider Notes (Signed)
 [facility] Provider Note    Event Date/Time   First MD Initiated Contact with Patient 12/25/23 1111     (approximate)   History   Vaginal Bleeding   HPI  Nicole Branch is a 23 y.o. female who presents today for evaluation of vaginal bleeding.  Patient estimates herself to be 5 weeks 6 days pregnant.  She reports that she began to have bleeding and cramping.  No other vaginal discharge.  She is not passing clots.  No fevers or chills.  No urinary symptoms.  Patient Active Problem List   Diagnosis Date Noted   Cannabis abuse 02/12/2022   Nicotine  abuse 02/12/2022   Suicide attempt (HCC) 11/25/2021   Depression 11/25/2021   Diphenhydramine overdose 11/25/2021   Intentional overdose (HCC)    Current moderate episode of major depressive disorder without prior episode (HCC) 08/29/2021   Generalized anxiety disorder 08/29/2021   ADHD (attention deficit hyperactivity disorder)    IBS (irritable bowel syndrome) 2021   OCD (obsessive compulsive disorder) 03/10/2018   Anxiety 03/10/2018   Attention deficit hyperactivity disorder (ADHD) 03/10/2018   GERD (gastroesophageal reflux disease) 2015   Asthma 2013          Physical Exam   Triage Vital Signs: ED Triage Vitals [12/25/23 1031]  Encounter Vitals Group     BP 114/76     Systolic BP Percentile      Diastolic BP Percentile      Pulse Rate 89     Resp 17     Temp 98.4 F (36.9 C)     Temp Source Oral     SpO2 100 %     Weight 144 lb 2.9 oz (65.4 kg)     Height 5\' 3"  (1.6 m)     Head Circumference      Peak Flow      Pain Score 5     Pain Loc      Pain Education      Exclude from Growth Chart     Most recent vital signs: Vitals:   12/25/23 1031 12/25/23 1335  BP: 114/76 108/72  Pulse: 89 85  Resp: 17 16  Temp: 98.4 F (36.9 C) 98.4 F (36.9 C)  SpO2: 100% 100%    Physical Exam Vitals and nursing note reviewed.  Constitutional:      General: Awake and alert. No acute  distress.    Appearance: Normal appearance. The patient is normal weight.  HENT:     Head: Normocephalic and atraumatic.     Mouth: Mucous membranes are moist.  Eyes:     General: PERRL. Normal EOMs        Right eye: No discharge.        Left eye: No discharge.     Conjunctiva/sclera: Conjunctivae normal.  Cardiovascular:     Rate and Rhythm: Normal rate and regular rhythm.     Pulses: Normal pulses.  Pulmonary:     Effort: Pulmonary effort is normal. No respiratory distress.     Breath sounds: Normal breath sounds.  Abdominal:     Abdomen is soft. There is no abdominal tenderness. No rebound or guarding. No distention. Musculoskeletal:        General: No swelling. Normal range of motion.     Cervical back: Normal range of motion and neck supple.  Skin:    General: Skin is warm and dry.     Capillary Refill: Capillary refill takes less than 2 seconds.  Findings: No rash.  Neurological:     Mental Status: The patient is awake and alert.      ED Results / Procedures / Treatments   Labs (all labs ordered are listed, but only abnormal results are displayed) Labs Reviewed  CBC - Abnormal; Notable for the following components:      Result Value   MCV 79.5 (*)    All other components within normal limits  BASIC METABOLIC PANEL WITH GFR - Abnormal; Notable for the following components:   CO2 21 (*)    All other components within normal limits  HCG, QUANTITATIVE, PREGNANCY - Abnormal; Notable for the following components:   hCG, Beta Chain, Quant, S 58,911 (*)    All other components within normal limits  URINALYSIS, ROUTINE W REFLEX MICROSCOPIC - Abnormal; Notable for the following components:   Color, Urine YELLOW (*)    APPearance HAZY (*)    Hgb urine dipstick MODERATE (*)    Ketones, ur 80 (*)    Bacteria, UA MANY (*)    All other components within normal limits  POC URINE PREG, ED - Abnormal; Notable for the following components:   Preg Test, Ur POSITIVE (*)     All other components within normal limits  ABO/RH     EKG     RADIOLOGY I independently reviewed and interpreted imaging and agree with radiologists findings.     PROCEDURES:  Critical Care performed:   Procedures   MEDICATIONS ORDERED IN ED: Medications - No data to display   IMPRESSION / MDM / ASSESSMENT AND PLAN / ED COURSE  I reviewed the triage vital signs and the nursing notes.   Differential diagnosis includes, but is not limited to, spontaneous abortion, inevitable abortion, implantation bleeding, subchorionic hemorrhage, ectopic pregnancy.  Patient is awake and alert, hemodynamically stable and afebrile.  She is nontoxic in appearance.  She has no reproducible abdominal tenderness on exam.  Labs obtained reveal a stable H&H, beta-hCG of 58,911.  She is Rh+, no need for RhoGAM.  Ultrasound obtained reveals probable early intrauterine gestational sac and yolk sac but no fetal pole or cardiac activity yet visualized.  We recommended hCG recheck this week for trending to see if pregnancy is developing or if she is having a spontaneous abortion.  Patient has many bacteria in her urine, will treat with Keflex  given asymptomatic bacteriuria in pregnancy.  She understands return precautions in the meantime.  She was discharged in stable condition.   Patient's presentation is most consistent with acute complicated illness / injury requiring diagnostic workup.      FINAL CLINICAL IMPRESSION(S) / ED DIAGNOSES   Final diagnoses:  Vaginal bleeding in pregnancy  Bacteriuria in pregnancy     Rx / DC Orders   ED Discharge Orders          Ordered    cephALEXin  (KEFLEX ) 500 MG capsule  4 times daily        12/25/23 1448             Note:  This document was prepared using Dragon voice recognition software and may include unintentional dictation errors.   Rosser Collington E, PA-C 12/25/23 1449    Collis Deaner, MD 12/28/23 [phone]

## 2023-12-25 NOTE — Discharge Instructions (Signed)
 Please follow-up with OB/GYN to have a repeat hCG drawn in 2 days.  Please return for any new, worsening, or change in symptoms or other concerns.  It was a pleasure caring for you today.

## 2023-12-25 NOTE — ED Triage Notes (Signed)
 Pt here with vaginal bleeding. Pt is 5 weeks and 6 days and states she began having spotting yesterday. Pt also having cramping as well. Pt denies clots but endorses nausea and vomiting. Pt stable in triage.

## 2023-12-29 ENCOUNTER — Other Ambulatory Visit

## 2023-12-29 ENCOUNTER — Other Ambulatory Visit: Payer: Self-pay

## 2023-12-29 DIAGNOSIS — J45909 Unspecified asthma, uncomplicated: Secondary | ICD-10-CM | POA: Insufficient documentation

## 2023-12-29 DIAGNOSIS — O26811 Pregnancy related exhaustion and fatigue, first trimester: Secondary | ICD-10-CM | POA: Insufficient documentation

## 2023-12-29 DIAGNOSIS — Z3A01 Less than 8 weeks gestation of pregnancy: Secondary | ICD-10-CM | POA: Diagnosis not present

## 2023-12-29 MED ORDER — ONDANSETRON 4 MG PO TBDP: 4.0000 mg | ORAL_TABLET | Freq: Three times a day (TID) | ORAL | 1 refills | Status: DC | PRN

## 2023-12-29 NOTE — ED Notes (Signed)
 Pt refusing labs at this time, states she had them drawn at [facility] earlier today before syncopal episode.

## 2023-12-29 NOTE — ED Triage Notes (Addendum)
 Pt reports she had a syncopal episode due to heat earlier today, pt states when she fell she hit her head and is having some dizziness. Pt reports she is aprox [redacted] weeks pregnant.

## 2023-12-30 ENCOUNTER — Emergency Department
Admission: EM | Admit: 2023-12-30 | Discharge: 2023-12-30 | Disposition: A | Discharge status: Home / Self Care | Attending: Emergency Medicine | Admitting: Emergency Medicine

## 2023-12-30 DIAGNOSIS — R55 Syncope and collapse: Secondary | ICD-10-CM

## 2023-12-30 LAB — BETA HCG QUANT (REF LAB): hCG Quant: 95493 m[IU]/mL

## 2023-12-30 NOTE — Discharge Instructions (Signed)
 Drink plenty of fluids daily.  Return to the ER for worsening symptoms, persistent vomiting, difficulty breathing or other concerns.

## 2023-12-30 NOTE — ED Provider Notes (Signed)
 [facility] Provider Note    Event Date/Time   First MD Initiated Contact with Patient 12/30/23 0205     (approximate)   History   Loss of Consciousness   HPI  Nicole Branch is a 23 y.o. female who presents to the ED from home with a chief complaint of syncope.  Patient is approximately [redacted] weeks pregnant, was visiting a friend in their camper.  Felt hot, stood up and passed out, striking the back of her head.  Incident occurred around 8 PM.  Denies chest pain, shortness of breath, abdominal pain, nausea, vomiting, dizziness, vision changes, headache.     Past Medical History   Past Medical History:  Diagnosis Date   ADHD (attention deficit hyperactivity disorder)    Allergies    Anxiety 2019   Asthma 2013   GERD (gastroesophageal reflux disease) 2015   IBS (irritable bowel syndrome) 2021   [facility]     Active Problem List   Patient Active Problem List   Diagnosis Date Noted   Cannabis abuse 02/12/2022   Nicotine  abuse 02/12/2022   Suicide attempt (HCC) 11/25/2021   Depression 11/25/2021   Diphenhydramine overdose 11/25/2021   Intentional overdose (HCC)    Current moderate episode of major depressive disorder without prior episode (HCC) 08/29/2021   Generalized anxiety disorder 08/29/2021   ADHD (attention deficit hyperactivity disorder)    IBS (irritable bowel syndrome) 2021   OCD (obsessive compulsive disorder) 03/10/2018   Anxiety 03/10/2018   Attention deficit hyperactivity disorder (ADHD) 03/10/2018   GERD (gastroesophageal reflux disease) 2015   Asthma 2013     Past Surgical History   Past Surgical History:  Procedure Laterality Date   NO PAST SURGERIES       Home Medications   Prior to Admission medications   Medication Sig Start Date End Date Taking? Authorizing Provider  albuterol  (PROVENTIL ) (2.5 MG/3ML) 0.083% nebulizer solution Take 3 mLs (2.5 mg total) by nebulization every 4 (four) hours as needed for  wheezing or shortness of breath. 08/10/22 08/10/23  Lynnda Sas, MD  albuterol  (VENTOLIN  HFA) 108 [phone] Base) MCG/ACT inhaler Inhale 2-4 puffs by mouth every 4 hours as needed for wheezing, cough, and/or shortness of breath 08/10/22   Lynnda Sas, MD  cephALEXin  (KEFLEX ) 500 MG capsule Take 1 capsule (500 mg total) by mouth 4 (four) times daily for 7 days. 12/25/23 01/01/24  Poggi, Jenna E, PA-C  hydrOXYzine  (ATARAX ) 25 MG tablet Take 0.5-1 tablets(12.5-25 mg total) by mouth 3(three) times daily as needed for anxiety, and 1 tablet(25 mg total) at bedtime as needed for sleeping difficulties. Patient not taking: Reported on 06/02/2023 11/28/21   Pilar Bridge, MD  ondansetron  (ZOFRAN -ODT) 4 MG disintegrating tablet Take 1 tablet (4 mg total) by mouth every 8 (eight) hours as needed for nausea or vomiting. 12/29/23   Slaughterbeck, Sherline Distel, CNM     Allergies  Amoxicillin   Family History   Family History  Adopted: Yes  Problem Relation Age of Onset   Drug abuse Mother    ADD / ADHD Sister      Physical Exam  Triage Vital Signs: ED Triage Vitals  Encounter Vitals Group     BP 12/29/23 2202 119/83     Systolic BP Percentile --      Diastolic BP Percentile --      Pulse Rate 12/29/23 2202 83     Resp 12/29/23 2202 16     Temp 12/29/23 2202 98.1 F (36.7  C)     Temp src --      SpO2 12/29/23 2202 100 %     Weight 12/29/23 2201 144 lb (65.3 kg)     Height 12/29/23 2201 5\' 3"  (1.6 m)     Head Circumference --      Peak Flow --      Pain Score 12/29/23 2201 2     Pain Loc --      Pain Education --      Exclude from Growth Chart --     Updated Vital Signs: BP 119/83   Pulse 83   Temp 98.1 F (36.7 C)   Resp 16   Ht 5\' 3"  (1.6 m)   Wt 65.3 kg   LMP 11/14/2023 (Exact Date)   SpO2 100%   BMI 25.51 kg/m    General: Awake, no distress.  CV:  RRR.  Good peripheral perfusion.  Resp:  Normal effort.  CTAB. Abd:  Nontender.  No distention.  Other:  Head is atraumatic.  PERRL.   EOMI.  Nose is atraumatic.  No dental malocclusion.  No midline cervical spine tenderness to palpation, step-offs or deformities noted.  Alert and oriented x 3.  CN II-XII grossly intact.  5/5 motor strength and sensation all extremities.   ED Results / Procedures / Treatments  Labs (all labs ordered are listed, but only abnormal results are displayed) Labs Reviewed  CBC WITH DIFFERENTIAL/PLATELET  BASIC METABOLIC PANEL WITH GFR  CK  URINALYSIS, ROUTINE W REFLEX MICROSCOPIC     EKG  ED ECG REPORT I, Khalid Lacko J, the attending physician, personally viewed and interpreted this ECG.   Date: 12/30/2023  EKG Time: 2203  Rate: 80  Rhythm: normal sinus rhythm  Axis: Normal  Intervals:right bundle branch block  ST&T Change: Nonspecific    RADIOLOGY None   Official radiology report(s): No results found.   PROCEDURES:  Critical Care performed: No  Procedures   MEDICATIONS ORDERED IN ED: Medications - No data to display   IMPRESSION / MDM / ASSESSMENT AND PLAN / ED COURSE  I reviewed the triage vital signs and the nursing notes.                             23 year old female approximately [redacted] weeks pregnant presenting with syncope.  Differential diagnosis includes but is not limited to orthostasis, ACS, PE, miscarriage, infectious, metabolic etiologies, etc.  I personally reviewed patient's records and note her ED visit from 12/25/2023 for vaginal bleeding.  Patient's presentation is most consistent with acute complicated illness / injury requiring diagnostic workup.  Patient reports bleeding completely resolved from prior visit.  Has not yet picked up her antibiotic for UTI.  Refuses lab work.  Does not wish to be stuck again since she recently had lab work.  Sat down and had a lengthy discussion with patient, explained reasoning for repeat lab work since she had a syncopal episode.  Patient continues to decline.  We did also discuss risk/benefit of obtaining CT head and  agreed to hold given patient has no neurological deficits.  Patient does agree to orthostatic vital signs.  Clinical Course as of 12/30/23 0247  Tue Dec 30, 2023  0246 Orthostatics unremarkable.  Patient continues to decline labs/IV.  Encouraged her to pick up her antibiotic prescription and to start it.  Patient will follow-up with her OB/GYN.  Strict return precautions given.  Patient verbalizes understanding and agrees with plan  of care. [JS]    Clinical Course User Index [JS] Norlene Beavers, MD     FINAL CLINICAL IMPRESSION(S) / ED DIAGNOSES   Final diagnoses:  Syncope, unspecified syncope type     Rx / DC Orders   ED Discharge Orders     None        Note:  This document was prepared using Dragon voice recognition software and may include unintentional dictation errors.   Olia Hinderliter J, MD 12/30/23 [phone]

## 2024-01-08 NOTE — Progress Notes (Unsigned)
 New OB Intake  I explained I am completing New OB Intake today. We discussed her EDD of 08/20/2024 that is based on LMP of 11/14/2023. Pt is G1/P0. I reviewed her allergies, medications, Medical/Surgical/OB history, and appropriate screenings. There are cats in the home: no. Based on history, this is a/an pregnancy uncomplicated . She has no obstetrical history this is her first pregnancy.  Patient Active Problem List   Diagnosis Date Noted   Cannabis abuse 02/12/2022   Nicotine  abuse 02/12/2022   Suicide attempt (HCC) 11/25/2021   Depression 11/25/2021   Diphenhydramine overdose 11/25/2021   Intentional overdose (HCC)    Current moderate episode of major depressive disorder without prior episode (HCC) 08/29/2021   Generalized anxiety disorder 08/29/2021   ADHD (attention deficit hyperactivity disorder)    IBS (irritable bowel syndrome) 2021   OCD (obsessive compulsive disorder) 03/10/2018   Anxiety 03/10/2018   Attention deficit hyperactivity disorder (ADHD) 03/10/2018   GERD (gastroesophageal reflux disease) 2015   Asthma 2013    Concerns addressed today: Talked about nausea and vomiting. Rx sent in refill on Zofran . Scheduled ultrasounds.  Delivery Plans:  Plans to deliver at [facility].  Anatomy US  Explained first scheduled US  will be 01/15/2024. Anatomy US  will be scheduled around [redacted] weeks gestational age.  Labs Discussed genetic screening with patient. Patient consents to genetic testing to be drawn at new OB visit. Discussed possible labs to be drawn at new OB appointment.  COVID Vaccine Patient has not had COVID vaccine.   Social Determinants of Health Food Insecurity: denies food insecurity WIC Referral: Patient is interested in referral to [facility].  Transportation: Patient denies transportation needs. Childcare: Discussed no children allowed at ultrasound appointments.   First visit review I reviewed new OB appt with pt. I explained she will have  blood work and pap smear/pelvic exam if indicated. Explained pt will be seen by Josue Nip at first visit; encounter routed to appropriate provider.   Aldona Amel, CMA 01/09/2024  8:23 AM

## 2024-01-09 ENCOUNTER — Ambulatory Visit (INDEPENDENT_AMBULATORY_CARE_PROVIDER_SITE_OTHER)

## 2024-01-09 VITALS — BP 110/67 | HR 106 | Wt 136.4 lb

## 2024-01-09 DIAGNOSIS — Z3490 Encounter for supervision of normal pregnancy, unspecified, unspecified trimester: Secondary | ICD-10-CM | POA: Insufficient documentation

## 2024-01-09 DIAGNOSIS — Z3689 Encounter for other specified antenatal screening: Secondary | ICD-10-CM | POA: Diagnosis not present

## 2024-01-09 DIAGNOSIS — R110 Nausea: Secondary | ICD-10-CM

## 2024-01-09 MED ORDER — ONDANSETRON 4 MG PO TBDP: 4.0000 mg | ORAL_TABLET | Freq: Three times a day (TID) | ORAL | 1 refills | Status: DC | PRN

## 2024-01-09 NOTE — Patient Instructions (Signed)
 Common Medications Safe in Pregnancy  Acne:      Constipation:  Benzoyl Peroxide     Colace  Clindamycin      Dulcolax Suppository  Topica Erythromycin     Fibercon  Salicylic Acid      Metamucil         Miralax AVOID:        Senakot   Accutane    Cough:  Retin-A       Cough Drops  Tetracycline      Phenergan w/ Codeine if Rx  Minocycline      Robitussin (Plain & DM)  Antibiotics:     Crabs/Lice:  Ceclor       RID  Cephalosporins    AVOID:  E-Mycins      Kwell  Keflex   Macrobid/Macrodantin   Diarrhea:  Penicillin      Kao-Pectate  Zithromax      Imodium AD         PUSH FLUIDS AVOID:       Cipro     Fever:  Tetracycline      Tylenol  (Regular or Extra  Minocycline       Strength)  Levaquin      Extra Strength-Do not          Exceed 8 tabs/24 hrs Caffeine:        200mg /day (equiv. To 1 cup of coffee or  approx. 3 12 oz sodas)         Gas: Cold/Hayfever:       Gas-X  Benadryl      Mylicon  Claritin       Phazyme  **Claritin-D        Chlor-Trimeton    Headaches:  Dimetapp      ASA-Free Excedrin  Drixoral-Non-Drowsy     Cold Compress  Mucinex (Guaifenasin)     Tylenol  (Regular or Extra  Sudafed/Sudafed-12 Hour     Strength)  **Sudafed PE Pseudoephedrine   Tylenol  Cold & Sinus     Vicks Vapor Rub  Zyrtec  **AVOID if Problems With Blood Pressure         Heartburn: Avoid lying down for at least 1 hour after meals  Aciphex      Maalox     Rash:  Milk of Magnesia     Benadryl    Mylanta       1% Hydrocortisone Cream  Pepcid  Pepcid Complete   Sleep Aids:  Prevacid      Ambien   Prilosec       Benadryl  Rolaids       Chamomile Tea  Tums (Limit 4/day)     Unisom         Tylenol  PM         Warm milk-add vanilla or  Hemorrhoids:       Sugar for taste  Anusol/Anusol H.C.  (RX: Analapram 2.5%)  Sugar Substitutes:  Hydrocortisone OTC     Ok in moderation  Preparation H      Tucks        Vaseline lotion applied to tissue with  wiping    Herpes:     Throat:  Acyclovir      Oragel  Famvir  Valtrex     Vaccines:         Flu Shot Leg Cramps:       *Gardasil  Benadryl      Hepatitis A         Hepatitis B Nasal Spray:  Pneumovax  Saline Nasal Spray     Polio Booster         Tetanus Nausea:       Tuberculosis test or PPD  Vitamin B6 25 mg TID   AVOID:    Dramamine      *Gardasil  Emetrol       Live Poliovirus  Ginger Root 250 mg QID    MMR (measles, mumps &  High Complex Carbs @ Bedtime    rebella)  Sea Bands-Accupressure    Varicella (Chickenpox)  Unisom 1/2 tab TID     *No known complications           If received before Pain:         Known pregnancy;   Darvocet       Resume series after  Lortab        Delivery  Percocet    Yeast:   Tramadol      Femstat  Tylenol  3      Gyne-lotrimin  Ultram       Monistat  Vicodin           MISC:         All Sunscreens           Hair Coloring/highlights          Insect Repellant's          (Including DEET)         Mystic Tans medica

## 2024-01-15 ENCOUNTER — Ambulatory Visit

## 2024-01-15 DIAGNOSIS — Z3401 Encounter for supervision of normal first pregnancy, first trimester: Secondary | ICD-10-CM | POA: Diagnosis not present

## 2024-01-15 DIAGNOSIS — Z3A08 8 weeks gestation of pregnancy: Secondary | ICD-10-CM

## 2024-01-21 ENCOUNTER — Telehealth: Payer: Self-pay

## 2024-01-21 NOTE — Telephone Encounter (Signed)
 Pt calling triage asking if we can complete US  report for US  done on 01/15/24. She is transferring care to [facility] and they need report so they can schedule her appointments. Advised we are waiting for MD to sign off report. Once he signs off they should be able to see it.

## 2024-01-26 ENCOUNTER — Ambulatory Visit: Payer: Self-pay | Admitting: Obstetrics

## 2024-02-09 NOTE — Progress Notes (Signed)
 HPI: Nicole Branch is here to initiate prenatal care.  She is a 23 y.o., female, G1P0, at [redacted]w[redacted]d based on LMP of 11/14/23, with an Estimated Date of Delivery: 08/20/24  Transfer of care: [facility]  Patient reports the following questions or concerns: N/V  She is currently experiencing nausea and vomiting and denies bleeding, contractions, cramping or leaking.   Patient's last menstrual period was 11/14/2023. Certain:  yes Regular menses? yes  Cycles every 30 days  Estimated Date of Delivery: 08/20/24  Ultrasound? yes Planned pregnancy? no Desired pregnancy? yes Fertility Treatements? no  PMHx:   History of anemia:  yes Exposure to smoke: yes  Past Medical History:  Diagnosis Date  . Anxiety   . Asthma without status asthmaticus (HHS-HCC)      PSHx:   History reviewed. No pertinent surgical history.  POBHx:   OB History  Gravida Para Term Preterm AB Living  1       SAB IAB Ectopic Molar Multiple Live Births           # Outcome Date GA Lbr Len/2nd Weight Sex Type Anes PTL Lv  1 Current             OB Risk Assessment: Age >35y? no Age >40 [facility]?  no Previous recurrent pregnancy loss or stillbirth: no Exposure to smoke: yes Pt vapes Exposure to drugs or alcohol:  no History of sexually transmitted infections:  no Contact with children: no  Genetic Risk Assessment: Family or personal history of sickle cell anemia or trait:  no Family genetic syndromes (tay sachs, fragile x, CF, muscular dystrophy, huntington chorea, etc):  no Familial cardiac defects:  no Family history of mental retardation or autism:  no Maternal metabolic syndrome: no  GYNHx:  GYN Surgery: none Last pap: n/a Abnormal Pap Hx: no If abnormal in the past, history of LEEP/CKC: n/a History of sexually transmitted infections:  no  Allergy List: Allergies  Allergen Reactions  . Amoxicillin Hives and Rash    Meds:   Current Outpatient Medications  Medication Sig Dispense Refill  .  albuterol  MDI, PROVENTIL , VENTOLIN , PROAIR , HFA 90 mcg/actuation inhaler Inhale 2 inhalations into the lungs every 4 (four) hours as needed 6.7 g 0  . ondansetron  (ZOFRAN -ODT) 4 MG disintegrating tablet Take 1 tablet (4 mg total) by mouth 2 (two) times daily as needed for up to 14 days 28 tablet 0  . ARIPiprazole  (ABILIFY ) 5 MG tablet Take 5 mg by mouth once daily (Patient not taking: Reported on 02/10/2024)    . fluticasone propionate (FLONASE) 50 mcg/actuation nasal spray Place 1 spray into both nostrils 2 (two) times daily (Patient not taking: Reported on 02/10/2024) 16 g 0  . lamoTRIgine (LAMICTAL) 100 MG tablet Take 100 mg by mouth once daily (Patient not taking: Reported on 02/10/2024)    . medroxyPROGESTERone  (DEPO-PROVERA ) 150 mg/mL IM syringe Inject 150 mg into the muscle every 3 (three) months (Patient not taking: Reported on 02/10/2024)    . nicotine  polacrilex (NICORETTE ) 2 mg gum Take by mouth (Patient not taking: Reported on 02/10/2024)    . predniSONE  (DELTASONE ) 10 MG tablet 6 PO Q D X 1 DAY, THEN 5 PO Q D X 1 DAY, THEN 4 PO Q D X 1 DAY, THEN 3 PO Q D X 1 DAY, THEN 2 PO Q D X 1 DAY, THEN 1 PO Q D X 1 DAY (Patient not taking: Reported on 02/10/2024) 21 tablet 0  . promethazine-dextromethorphan (PROMETHAZINE-DM) 6.25-15 mg/5 mL syrup  Take 5 mLs by mouth every 6 (six) hours as needed (Patient not taking: Reported on 02/10/2024) 120 mL 0  . sertraline  (ZOLOFT ) 25 MG tablet Take 25 mg by mouth once daily (Patient not taking: Reported on 02/10/2024)    . sertraline  (ZOLOFT ) 50 MG tablet Take 75 mg by mouth once daily (Patient not taking: Reported on 02/10/2024)    . VRAYLAR 3 mg capsule  (Patient not taking: Reported on 02/10/2024)     No current facility-administered medications for this visit.    Family History/Genetics Screening: No family history on file.  Social History: Social History   Socioeconomic History  . Marital status: Single  Tobacco Use  . Smoking status: Former    Types:  Cigarettes    Passive exposure: Current  . Smokeless tobacco: Never  . Tobacco comments:    vaping  Vaping Use  . Vaping status: Every Day  Substance and Sexual Activity  . Alcohol use: Not Currently    Comment: Social  . Drug use: Not Currently    Types: Marijuana  . Sexual activity: Yes    Partners: Male    Birth control/protection: None   Social Drivers of Health   Financial Resource Strain: Low Risk  (02/10/2024)   Overall Financial Resource Strain (CARDIA)   . Difficulty of Paying Living Expenses: Not hard at all  Food Insecurity: No Food Insecurity (02/10/2024)   Hunger Vital Sign   . Worried About Programme researcher, broadcasting/film/video in the Last Year: Never true   . Ran Out of Food in the Last Year: Never true  Transportation Needs: No Transportation Needs (02/10/2024)   PRAPARE - Transportation   . Lack of Transportation (Medical): No   . Lack of Transportation (Non-Medical): No    History of abuse (sexual, emotional, physical): no History of anxiety/depression: yes  Infectious Screening: Risk factors for TB? no Varicella infection in the past or immunization: yes Cats in home: no Zika virus exposure:  no Flu vaccine: not in season History of blood transfusion: no  Planning/Education: Willing to accept a blood transfusion if her life depended on it? no Have you received Rhogam with a past pregnancy or during this pregnancy? N/a Patient informed that HIV testing will be performed during her prenatal care and agrees to testing: yes  Patient Active Problem List  Diagnosis  . Attention deficit hyperactivity disorder (ADHD)  . Anxiety  . OCD (obsessive compulsive disorder)  . Encounter for supervision of high risk pregnancy in first trimester, antepartum (HHS-HCC)  . Asthma (HHS-HCC)  . Cannabis abuse  . Current moderate episode of major depressive disorder without prior episode (CMS/HHS-HCC)  . Depression  . Diphenhydramine overdose  . Generalized anxiety disorder  . GERD  (gastroesophageal reflux disease)  . IBS (irritable bowel syndrome)  . Intentional overdose (CMS/HHS-HCC)  . Nicotine  abuse  . Suicide attempt (CMS/HHS-HCC)     ROS: Pertinent positives and negatives in the HPI, otherwise a 10 system ROS was negative.  EXAM: Constitutional: Vitals:   02/10/24 1505  BP: 115/73  Pulse: 84   Body mass index is 23.74 kg/m.  General Appearance:    Well-developed, well-nourished, no acute distress, appears stated age  Head:    Normocephalic, without obvious abnormality, atraumatic  Neck:   Supple, symmetrical, trachea midline, no adenopathy;    thyroid: no enlargement/tenderness/nodules  Back:     Symmetric, no curvature, no CVA tenderness  Lungs:     respirations unlabored  Chest Wall:    No tenderness  or deformity  Breast Exam:    Deferred   Abdomen:     Soft, non-tender, no rebound tenderness, no masses, no organomegaly, FHT 150s  Pelvic:  deferred  Extremities:   Extremities normal, atraumatic, no cyanosis or edema  Pulses:   2+ and symmetric lower extremities  Skin:   Skin color, texture, turgor normal, no rashes or lesions  Neuro: Psych:   Alert, oriented x3  Appropriate mood and insight, judgement intact     Assessment & Plan:   23 y.o. G1P0 @ at [redacted]w[redacted]d with:   Supervision of high risk pregnancy in first trimester (HHS-HCC)  (primary encounter diagnosis) Plan: CBC/D/Plt+RPR+Rh+ABO+RubIgG... - [facility], Rapid       HIV, Urinalysis w/Microscopic, Urine Culture,        Prenatal, w/GBS - [facility], Hemoglobin A1C,        Thyroid Stimulating-Hormone (TSH), Hgb        Fractionation Cascade - [facility], [id number]        PLUS Core+SCA - Chief Operating Officer, Inheritest(R) CF/SMA        Panel - [facility], Xpert CT/NG, PCR - [facility]  Routine screening for STI (sexually transmitted infection)  Encounter for supervision of normal first pregnancy in first trimester (HHS-HCC)  Uncomplicated asthma, unspecified asthma severity, unspecified whether persistent  (HHS-HCC)  Anxiety  Vapes nicotine  containing substance  Nausea and vomiting in pregnancy (HHS-HCC) Plan: ondansetron  (ZOFRAN -ODT) 4 MG disintegrating        tablet   Asthma Mild - only with viral infections H/o mental health diagnoses: Anxiety, ADHD, Depression Medications prior to pregnancy: Lamictal and Vraylar Medications during pregnancy: None Counseling: none at time of NOB Vapes Discussed increasing her folic acid to 199frh-$MzfnczAzqnmzIZPI_lOibZivNzmrTScWLtsBSlJPgrxhlvLIw$$MzfnczAzqnmzIZPI_lOibZivNzmrTScWLtsBSlJPgrxhlvLIw$  How Often: 02/10/24 twice a day Smoking cessation plan or program: Plans to decrease over a short time If she continues to smoke Growth U/S every 4-6 wks @ 28 wks Deliver 39-40.6 wks    ASA Therapy in Pregnancy:   Low-dose aspirin (81mg  per day) started between 12 and 28 weeks of gestation  is used to reduce the occurrence of preeclampsia, preterm birth, and intrauterine growth restriction in women at increased risk of preeclampsia.   Risk factors in this pregnancy:  Moderate risk factor (2 or more): Nulliparity  High risk factor (1 or more): None  Low dose aspirin not indicated.   1) Supervision of pregnancy: -Standard OB labs ordered, including HIV, GC/CT, and urine cultures. Verbal consent obtained for HIV testing.  -Declines pap smear today, aware she will get one PP - Reviewed nutrition, diet, and exercise in pregnancy. Healthy diet and lifestyle choices encouraged.  - Reviewed dietary instructions and restrictions: increased protein, fruits and vegetables daily and ample water intake; avoiding fish more than 1-2 servings per week, unpasturized dairy products and any recalled products to avoid listeriosis, raw meat, raw fish and need to heat hot dogs and deli meats prior to eating. - Reviewed prenatal vitamin - one everyday. -Recommended weight gain discussed. IOM/WHO weight gain recommendations for pregnancy 2009 Singleton pregnancy -- The current recommendations for singleton pregnancy are: BMI 18.5 to 24.9 kg/m2 (normal weight) --  weight gain 25 to 35 lbs (11.5 to 16.0 kg) - Precautions, limitations, and expectations reviewed - Reviewed normal pregnancy discomforts and comfort measures  - OTC meds reviewed  2) Genetic testing and screening: - Discussed options for genetic screening. Limitations of testing reviewed.   - Discussed cfDNA screen after 9 weeks to test for aneuploidy. Patient desires this screening.  - Discussed AFP/quad screen between  15 & 21 weeks. Patient desires. - Discussed the Anatomical Ultrasound to be done by 20 weeks here.  3) Oriented to [facility] OB/GYN practice model:  I have reviewed the complimentary nature of our practice which incorporates diverse care providers including certified nurse midwives and physicians. I have reviewed that we each provide a unique perspective on pregnancy and birth experience that will enhance their experience and encourage that patient/couple seek to see all types of our highly qualified providers. I have reviewed that most patients will have approximately 12-14 visits and an opportunity to meet all members of their care team. Shared call  between providers and [facility] site of delivery were also discussed as well as resources for childbirth and breastfeeding education at the [facility] campus. The [facility] OBGYN Prenatal Booklet was also reviewed with the patient.   - Call for warning signs (vaginal bleeding, cramping, change in discharge) or worsening of symptoms. Reviewed how and when to call the office, patient verbalized understanding. - Return in about 4 weeks (around 03/09/2024) for Routine OB visit with MD.    Randell Statement:   I personally performed the service, non-incident to. [facility])   JENNIFER RICHARDSON MYRON DELON MYRON, CNM 02/10/2024 4:14 PM

## 2024-02-10 LAB — OB RESULTS CONSOLE VARICELLA ZOSTER ANTIBODY, IGG: Varicella: IMMUNE

## 2024-02-10 LAB — OB RESULTS CONSOLE RUBELLA ANTIBODY, IGM: Rubella: IMMUNE

## 2024-02-10 LAB — OB RESULTS CONSOLE HIV ANTIBODY (ROUTINE TESTING): HIV: NONREACTIVE

## 2024-02-10 LAB — OB RESULTS CONSOLE HEPATITIS B SURFACE ANTIGEN: Hepatitis B Surface Ag: NEGATIVE

## 2024-02-12 ENCOUNTER — Encounter: Admitting: Obstetrics

## 2024-03-10 ENCOUNTER — Telehealth: Payer: Self-pay

## 2024-03-10 NOTE — Telephone Encounter (Signed)
 Error

## 2024-03-23 ENCOUNTER — Ambulatory Visit: Attending: Obstetrics and Gynecology

## 2024-03-23 DIAGNOSIS — D573 Sickle-cell trait: Secondary | ICD-10-CM | POA: Insufficient documentation

## 2024-03-23 NOTE — Progress Notes (Signed)
 [facility] at [facility] [address] Phone:  [phone]   Fax:  [phone]      In-Person Genetic Counseling Clinic Note:   I spoke with 23 y.o. Nicole Branch today to discuss her carrier screening results. She was referred by Myron Nest, CNM. She was accompanied by FOB Nicole Branch.   Pregnancy History:    G1P0. EGA: [redacted]w[redacted]d by LMP. EDD: 08/20/2024. Reports she takes PNVs and iron gummies. Personal history of asthma. Denies major personal health concerns. Denies bleeding, infections, and fevers in this pregnancy. Reports vaping ~2x/day. She is cutting down use and is trying to quit. We reviewed possible effects of vaping and nicotine  on a pregnancy and the current ACOG recommendations against vaping during pregnancy. Denies using other tobacco, alcohol, or street drugs in this pregnancy.   Family History:    A three-generation pedigree was created and scanned into Epic under the Media tab.  Patient is adopted and has limited family history.  FOB reports a paternal half brother with sickle cell trait. We offered FOB carrier screening for sickle cell disease; however, without review of reports it is possible that another hemoglobinopathy may be present and not included on the currently available carrier screening. FOB is encouraged to reach out with clarification or test reports if they become available.    Maternal ethnicity reported as unknown and paternal ethnicity reported as White. Denies Ashkenazi Jewish ancestry.  Family history not remarkable for consanguinity, individuals with birth defects, intellectual disability, autism spectrum disorder, multiple spontaneous abortions, still births, or unexplained neonatal death.   We reviewed the genetics of alpha thalassemia and beta-hemoglobinopathies, autosomal recessive mode of inheritance, and clinical features of these conditions.  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 Therefore, she has both the usual type of hemoglobin A and the altered type, hemoglobin S (Hb A/S).  We reviewed beta globin genes, hemoglobin, forms of beta-hemoglobinopathies and their natural histories, and the autosomal mode of inheritance. Nicole Branch will either pass down either the normal beta globin gene that produces the normal hemoglobin A or the altered beta globin gene that produces hemoglobin S. There would be a 25% risk for the pregnancy to be affected with sickle cell disease (HbS/S) if Nicole Branch's reproductive partner is also a carrier. There are several types of beta-hemoglobinopathies, including hemoglobin C, hemoglobin O, hemoglobin E, and beta-thalassemia. If Nicole Branch's reproductive partner is found to be a carrier for for a beta-hemoglobinopathy, there would be a 25% chance that this pregnancy would be affected. The type of beta-hemoglobinopathy and symptoms will vary depending on the genotype.   Given these results, we discussed and offered carrier screening for Nicole Branch's reproductive partner. We reviewed the benefits and limitations of carrier screening and that it can detect most but not all carriers.  The couple consented carrier screening for FOB. His blood was drawn during today's visit. He consented that we call Nicole Branch with the results. We reviewed that if both were found to be carriers, prenatal diagnosis through amniocentesis would be available. We reviewed the technical aspects, benefits, risks, and limitations of amniocentesis including the 1 in 500 risk for miscarriage.  In the meantime, we calculated the risk for the current pregnancy to be affected with a beta-hemoglobinopathy. Based on Nicole Branch's carrier screening results and her partner's ethnicity, the current pregnancy is at a 1 in 1492 (~0.07%) risk to be affected.  Of note, Nicole Branch was not found to be a carrier for the other two  conditions screened for (CF and SMA). This significantly reduces but does not eliminate the chance of being a carrier. Please see report for details.    Newborn Screening. The [place]  Newborn Screening (NBS) program will screen all newborn babies for cystic fibrosis, spinal muscular atrophy, hemoglobinopathies, and numerous other conditions.  Previous Testing Completed:  Low risk NIPS: Nicole Branch previously completed MaterniT21 noninvasive prenatal screening (NIPS) in this pregnancy. The result is low risk, consistent with a female fetus. This screening significantly reduces but does not eliminate the chance that the current pregnancy has Down syndrome (trisomy 77), trisomy 11, trisomy 56, and common sex chromosome conditions. Please see report for details. There are many genetic conditions that cannot be detected by NIPS.    Plan of Care:   FOB carrier screening for beta-hemoglobinopathies was drawn today. We will call the patient with the results. Routine prenatal care,   Informed consent was obtained. All questions were answered.   60 minutes were spent on the date of the encounter in service to the patient including preparation, face-to-face consultation, discussion of test reports and available next steps, pedigree construction, genetic risk assessment, documentation, and care coordination.    Thank you for sharing in the care of Nicole Branch with us .  Please do not hesitate to contact us  at [phone] if you have any questions.   Nicole Bodily, MS, [facility] Certified Genetic Counselor   Genetic counseling student involved in appointment: No.

## 2024-04-05 ENCOUNTER — Telehealth: Payer: Self-pay

## 2024-04-05 NOTE — Telephone Encounter (Signed)
 I called the patient to discuss her partner's carrier screening results [facility] DOB 03/01/2002). He was not found to be a carrier for beta-hemoglobinopathies including the sickle cell disease. Please see report for details. A negative result on carrier screening reduces but does not eliminate the chance of being a carrier. The chance this couple's current and future pregnancies would be affected with this condition is very low.  Lauraine Bodily, MS, [facility] Certified Genetic Counselor [facility] [phone]

## 2024-05-31 LAB — OB RESULTS CONSOLE RPR: RPR: NONREACTIVE

## 2024-06-08 ENCOUNTER — Emergency Department: Admission: EM | Admit: 2024-06-08 | Discharge: 2024-06-08 | Disposition: A | Discharge status: Home / Self Care

## 2024-06-08 ENCOUNTER — Other Ambulatory Visit: Payer: Self-pay

## 2024-06-08 DIAGNOSIS — M79631 Pain in right forearm: Secondary | ICD-10-CM | POA: Diagnosis not present

## 2024-06-08 DIAGNOSIS — J45909 Unspecified asthma, uncomplicated: Secondary | ICD-10-CM | POA: Diagnosis not present

## 2024-06-08 DIAGNOSIS — O99891 Other specified diseases and conditions complicating pregnancy: Secondary | ICD-10-CM | POA: Insufficient documentation

## 2024-06-08 MED ORDER — ACETAMINOPHEN 325 MG PO TABS: 650.0000 mg | ORAL_TABLET | ORAL | 2 refills | Status: DC | PRN

## 2024-06-08 MED ORDER — ACETAMINOPHEN 500 MG PO TABS
1000.0000 mg | ORAL_TABLET | Freq: Once | ORAL | Status: AC
  Administered 2024-06-08: 1000 mg via ORAL
  Filled 2024-06-08: qty 2

## 2024-06-08 NOTE — ED Triage Notes (Signed)
 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 Pt is also aprox [redacted] weeks pregnant.

## 2024-06-08 NOTE — Discharge Instructions (Signed)
 Your evaluation in the emergency department was overall reassuring.  I suspect you have some muscle strain and mild inflammation from repetitive work today.  This should improve on its own within the next few days.  You can use Tylenol  as needed for any ongoing discomfort.  Please do follow-up with your primary care provider and OB/GYN for reevaluation of any ongoing symptoms, and return to the emergency department with any new or worsening symptoms.

## 2024-06-08 NOTE — ED Provider Notes (Signed)
 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 Pt is also aprox [redacted] weeks pregnant.    HPI Nicole Branch is a 23 y.o. female PMH asthma, IBS, anxiety/depression, OCD presents for evaluation of right arm pain - Pain localizes to right forearm.  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  Felt fine afterward though throughout the evening developed some progressive pain in her right forearm.  No preceding trauma.  No overlying skin changes.  Was not sure what she should do so called an OB advice line and was told to come to emergency department for eval.  Was not sure what medications may be safe to take. -Has otherwise been in her usual state of health -No vaginal bleeding, lower abdominal pain, leakage of fluid.     Physical Exam   Triage Vital Signs: ED Triage Vitals  Encounter Vitals Group     BP 06/08/24 0342 118/85     Girls Systolic BP Percentile --      Girls Diastolic BP Percentile --      Boys Systolic BP Percentile --      Boys Diastolic BP Percentile --      Pulse Rate 06/08/24 0342 87     Resp 06/08/24 0342 17     Temp 06/08/24 0342 98.4 F (36.9 C)     Temp src --      SpO2 06/08/24 0342 100 %     Weight 06/08/24 0341 154 lb (69.9 kg)     Height 06/08/24 0341 5' 3 (1.6 m)     Head Circumference --      Peak Flow --      Pain Score 06/08/24 0341 6     Pain Loc --      Pain Education --      Exclude from Growth Chart --     Most recent vital signs: Vitals:   06/08/24 0342  BP: 118/85  Pulse: 87  Resp: 17  Temp: 98.4 F (36.9 C)  SpO2: 100%     General: Awake, no distress.  CV:  Good peripheral perfusion. RRR, RP  2+ Resp:  Normal effort.  RUE:   Mild tenderness palpation throughout forearm.  No hand/wrist/forearm swelling.  Radian/median/ulnar motor intact.  RP 2+.  No overlying skin changes.  Brisk capillary refill.  Compartments soft.   ED Results / Procedures / Treatments   Labs (all labs ordered are listed, but only abnormal results are displayed) Labs Reviewed - No data to display   EKG  N/a   RADIOLOGY N/a    PROCEDURES:  Critical Care performed: No  Procedures   MEDICATIONS ORDERED IN ED: Medications  acetaminophen  (TYLENOL ) tablet 1,000 mg (1,000 mg Oral Given 06/08/24 0441)     IMPRESSION / MDM / ASSESSMENT AND PLAN / ED COURSE  I reviewed the triage vital signs and the nursing notes.                              DDX/MDM/AP: Differential diagnosis includes, but is not limited to, muscular strain injury.  No clinical concern for underlying fracture or dislocation.  No evidence of cellulitis or compartment syndrome.  Plan: - Reassurance - Tylenol  - No indication for labs or imaging  Patient's presentation is most consistent with acute, uncomplicated illness.   ED course below.  Suspect MSK overuse injury.  No concern for acute pathology at this time.  Rx Tylenol , plan for PMD/OB follow-up.  ED return precautions in place.  Patient and partner agree with plan.      FINAL CLINICAL IMPRESSION(S) / ED DIAGNOSES   Final diagnoses:  Right forearm pain     Rx / DC Orders   ED Discharge Orders          Ordered    acetaminophen  (TYLENOL ) 325 MG tablet  Every 4 hours PRN        06/08/24 0440             Note:  This document was prepared using Dragon voice recognition software and may include unintentional dictation errors.   Clarine Ozell LABOR, MD 06/08/24 [phone]

## 2024-06-09 ENCOUNTER — Emergency Department (HOSPITAL_COMMUNITY)

## 2024-06-09 ENCOUNTER — Emergency Department (HOSPITAL_BASED_OUTPATIENT_CLINIC_OR_DEPARTMENT_OTHER)

## 2024-06-09 ENCOUNTER — Encounter (HOSPITAL_COMMUNITY): Payer: Self-pay

## 2024-06-09 ENCOUNTER — Other Ambulatory Visit: Payer: Self-pay

## 2024-06-09 ENCOUNTER — Inpatient Hospital Stay (HOSPITAL_COMMUNITY)
Admission: EM | Admit: 2024-06-09 | Discharge: 2024-06-14 | DRG: 818 | Disposition: A | Discharge status: Home / Self Care | Attending: Emergency Medicine | Admitting: Emergency Medicine

## 2024-06-09 DIAGNOSIS — O99613 Diseases of the digestive system complicating pregnancy, third trimester: Secondary | ICD-10-CM | POA: Diagnosis present

## 2024-06-09 DIAGNOSIS — O9A213 Injury, poisoning and certain other consequences of external causes complicating pregnancy, third trimester: Secondary | ICD-10-CM | POA: Diagnosis not present

## 2024-06-09 DIAGNOSIS — O23593 Infection of other part of genital tract in pregnancy, third trimester: Secondary | ICD-10-CM | POA: Diagnosis present

## 2024-06-09 DIAGNOSIS — O284 Abnormal radiological finding on antenatal screening of mother: Secondary | ICD-10-CM | POA: Diagnosis not present

## 2024-06-09 DIAGNOSIS — Z3A30 30 weeks gestation of pregnancy: Secondary | ICD-10-CM | POA: Diagnosis not present

## 2024-06-09 DIAGNOSIS — D62 Acute posthemorrhagic anemia: Secondary | ICD-10-CM | POA: Diagnosis not present

## 2024-06-09 DIAGNOSIS — K567 Ileus, unspecified: Secondary | ICD-10-CM | POA: Diagnosis present

## 2024-06-09 DIAGNOSIS — Y92410 Unspecified street and highway as the place of occurrence of the external cause: Secondary | ICD-10-CM

## 2024-06-09 DIAGNOSIS — O26893 Other specified pregnancy related conditions, third trimester: Secondary | ICD-10-CM | POA: Diagnosis present

## 2024-06-09 DIAGNOSIS — S82202A Unspecified fracture of shaft of left tibia, initial encounter for closed fracture: Secondary | ICD-10-CM | POA: Diagnosis present

## 2024-06-09 DIAGNOSIS — O43899 Other placental disorders, unspecified trimester: Secondary | ICD-10-CM

## 2024-06-09 DIAGNOSIS — S8292XA Unspecified fracture of left lower leg, initial encounter for closed fracture: Secondary | ICD-10-CM

## 2024-06-09 DIAGNOSIS — O43893 Other placental disorders, third trimester: Secondary | ICD-10-CM | POA: Diagnosis present

## 2024-06-09 DIAGNOSIS — O99013 Anemia complicating pregnancy, third trimester: Secondary | ICD-10-CM | POA: Diagnosis present

## 2024-06-09 DIAGNOSIS — Z3A29 29 weeks gestation of pregnancy: Secondary | ICD-10-CM

## 2024-06-09 DIAGNOSIS — S82302A Unspecified fracture of lower end of left tibia, initial encounter for closed fracture: Secondary | ICD-10-CM | POA: Diagnosis present

## 2024-06-09 DIAGNOSIS — N762 Acute vulvitis: Secondary | ICD-10-CM | POA: Diagnosis not present

## 2024-06-09 DIAGNOSIS — O468X3 Other antepartum hemorrhage, third trimester: Secondary | ICD-10-CM | POA: Diagnosis present

## 2024-06-09 DIAGNOSIS — O418X30 Other specified disorders of amniotic fluid and membranes, third trimester, not applicable or unspecified: Secondary | ICD-10-CM | POA: Diagnosis present

## 2024-06-09 HISTORY — DX: Anemia, unspecified: D64.9

## 2024-06-09 HISTORY — DX: Sickle-cell trait: D57.3

## 2024-06-09 LAB — COMPREHENSIVE METABOLIC PANEL WITH GFR
ALT: 66 U/L — ABNORMAL HIGH (ref 0–44)
AST: 137 U/L — ABNORMAL HIGH (ref 15–41)
Albumin: 2.7 g/dL — ABNORMAL LOW (ref 3.5–5.0)
Alkaline Phosphatase: 84 U/L (ref 38–126)
Anion gap: 12 (ref 5–15)
BUN: 7 mg/dL (ref 6–20)
CO2: 18 mmol/L — ABNORMAL LOW (ref 22–32)
Calcium: 8.3 mg/dL — ABNORMAL LOW (ref 8.9–10.3)
Chloride: 106 mmol/L (ref 98–111)
Creatinine, Ser: 0.38 mg/dL — ABNORMAL LOW (ref 0.44–1.00)
GFR, Estimated: >60 mL/min (ref >60)
Glucose, Bld: 88 mg/dL (ref 70–99)
Potassium: 3.7 mmol/L (ref 3.5–5.1)
Sodium: 136 mmol/L (ref 135–145)
Total Bilirubin: 0.3 mg/dL (ref 0.0–1.2)
Total Protein: 5.4 g/dL — ABNORMAL LOW (ref 6.5–8.1)

## 2024-06-09 LAB — CBC
HCT: 31.7 % — ABNORMAL LOW (ref 36.0–46.0)
Hemoglobin: 10.2 g/dL — ABNORMAL LOW (ref 12.0–15.0)
MCH: 26.1 pg (ref 26.0–34.0)
MCHC: 32.2 g/dL (ref 30.0–36.0)
MCV: 81.1 fL (ref 80.0–100.0)
Platelets: 247 K/uL (ref 150–400)
RBC: 3.91 MIL/uL (ref 3.87–5.11)
RDW: 12.8 % (ref 11.5–15.5)
WBC: 14.5 K/uL — ABNORMAL HIGH (ref 4.0–10.5)
nRBC: 0 % (ref 0.0–0.2)

## 2024-06-09 LAB — KLEIHAUER-BETKE STAIN
Fetal Cells %: 0.25 %
Quantitation Fetal Hemoglobin: 0.0025 mL

## 2024-06-09 LAB — SAMPLE TO BLOOD BANK

## 2024-06-09 LAB — I-STAT CHEM 8, ED
BUN: 6 mg/dL (ref 6–20)
Calcium, Ion: 1.04 mmol/L — ABNORMAL LOW (ref 1.15–1.40)
Chloride: 110 mmol/L (ref 98–111)
Creatinine, Ser: 0.6 mg/dL (ref 0.44–1.00)
Glucose, Bld: 94 mg/dL (ref 70–99)
HCT: 29 % — ABNORMAL LOW (ref 36.0–46.0)
Hemoglobin: 9.9 g/dL — ABNORMAL LOW (ref 12.0–15.0)
Potassium: 3 mmol/L — ABNORMAL LOW (ref 3.5–5.1)
Sodium: 140 mmol/L (ref 135–145)
TCO2: 17 mmol/L — ABNORMAL LOW (ref 22–32)

## 2024-06-09 LAB — ETHANOL: Alcohol, Ethyl (B): <15 mg/dL (ref <15)

## 2024-06-09 LAB — PROTIME-INR
INR: 1.1 (ref 0.8–1.2)
Prothrombin Time: 14.5 s (ref 11.4–15.2)

## 2024-06-09 LAB — FIBRINOGEN: Fibrinogen: 400 mg/dL (ref 210–475)

## 2024-06-09 LAB — ABO/RH: ABO/RH(D): O POS

## 2024-06-09 LAB — I-STAT CG4 LACTIC ACID, ED: Lactic Acid, Venous: 2.4 mmol/L (ref 0.5–1.9)

## 2024-06-09 MED ORDER — MORPHINE SULFATE (PF) 2 MG/ML IV SOLN
2.0000 mg | INTRAVENOUS | Status: DC | PRN
  Administered 2024-06-09 – 2024-06-14 (×27): 2 mg via INTRAVENOUS
  Filled 2024-06-09 (×27): qty 1

## 2024-06-09 MED ORDER — FENTANYL CITRATE (PF) 50 MCG/ML IJ SOSY
50.0000 ug | PREFILLED_SYRINGE | Freq: Once | INTRAMUSCULAR | Status: AC
  Administered 2024-06-09: 50 ug via INTRAVENOUS
  Filled 2024-06-09: qty 1

## 2024-06-09 MED ORDER — IOHEXOL 350 MG/ML SOLN
75.0000 mL | Freq: Once | INTRAVENOUS | Status: AC | PRN
  Administered 2024-06-09: 75 mL via INTRAVENOUS

## 2024-06-09 MED ORDER — LACTATED RINGERS IV SOLN: INTRAVENOUS | Status: DC

## 2024-06-09 MED ORDER — ACETAMINOPHEN 325 MG PO TABS: 650.0000 mg | ORAL_TABLET | ORAL | Status: DC | PRN

## 2024-06-09 MED ORDER — FENTANYL CITRATE (PF) 50 MCG/ML IJ SOSY
PREFILLED_SYRINGE | INTRAMUSCULAR | Status: AC | PRN
  Administered 2024-06-09: 50 ug via INTRAVENOUS

## 2024-06-09 MED ORDER — CALCIUM CARBONATE ANTACID 500 MG PO CHEW
2.0000 | CHEWABLE_TABLET | ORAL | Status: DC | PRN
  Administered 2024-06-10 – 2024-06-12 (×2): 400 mg via ORAL
  Filled 2024-06-09 (×2): qty 2

## 2024-06-09 MED ORDER — FENTANYL CITRATE (PF) 50 MCG/ML IJ SOSY
PREFILLED_SYRINGE | INTRAMUSCULAR | Status: AC
  Filled 2024-06-09: qty 1

## 2024-06-09 MED ORDER — PRENATAL MULTIVITAMIN CH
1.0000 | ORAL_TABLET | Freq: Every day | ORAL | Status: DC
  Administered 2024-06-10 – 2024-06-14 (×4): 1 via ORAL
  Filled 2024-06-09 (×4): qty 1

## 2024-06-09 MED ORDER — LACTATED RINGERS IV SOLN: 125.0000 mL/h | INTRAVENOUS | Status: AC

## 2024-06-09 MED ORDER — OXYCODONE HCL 5 MG PO TABS
5.0000 mg | ORAL_TABLET | ORAL | Status: DC | PRN
  Administered 2024-06-09 – 2024-06-10 (×4): 5 mg via ORAL
  Filled 2024-06-09 (×4): qty 1

## 2024-06-09 MED ORDER — ACETAMINOPHEN 325 MG PO TABS
650.0000 mg | ORAL_TABLET | Freq: Four times a day (QID) | ORAL | Status: DC
  Administered 2024-06-09 – 2024-06-10 (×3): 650 mg via ORAL
  Filled 2024-06-09 (×3): qty 2

## 2024-06-09 MED ORDER — FENTANYL CITRATE (PF) 50 MCG/ML IJ SOSY
50.0000 ug | PREFILLED_SYRINGE | Freq: Once | INTRAMUSCULAR | Status: DC
  Filled 2024-06-09: qty 1

## 2024-06-09 MED ORDER — LORAZEPAM 2 MG/ML IJ SOLN
1.0000 mg | Freq: Once | INTRAMUSCULAR | Status: DC
  Filled 2024-06-09: qty 1

## 2024-06-09 MED ORDER — DOCUSATE SODIUM 100 MG PO CAPS
100.0000 mg | ORAL_CAPSULE | Freq: Every day | ORAL | Status: DC
  Filled 2024-06-09 (×3): qty 1

## 2024-06-09 NOTE — ED Notes (Signed)
 Patient placed on cardiac monitor. EDP at bedside.

## 2024-06-09 NOTE — ED Notes (Signed)
 Pt transported to MAU with rapid OB and TRN.
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
## 2024-06-09 NOTE — Progress Notes (Signed)
 Orthopedic Tech Progress Note Patient Details:  Nicole Branch 2001/07/05 [id number]  Ortho Devices Type of Ortho Device: Short leg splint, Stirrup splint Ortho Device/Splint Location: LLE Ortho Device/Splint Interventions: Ordered, Application, Adjustment   Post Interventions Patient Tolerated: Fair Instructions Provided: Adjustment of device, Care of device  [name] 06/09/2024, 2:59 PM
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
## 2024-06-09 NOTE — Progress Notes (Addendum)
 OBOR made aware of pt and hematoma that was noted on CT.  They were already aware and have pulled for Dr Lola
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
## 2024-06-09 NOTE — ED Notes (Signed)
 CCMD called.

## 2024-06-09 NOTE — Progress Notes (Signed)
 Pt transferred to [facility] room 110.  Maggie, RN shown entirety of fhr tracing and then RROB proceeded to take tracing up to Dr Abigail for her to see.
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
## 2024-06-10 LAB — CBC
HCT: 26.4 % — ABNORMAL LOW (ref 36.0–46.0)
Hemoglobin: 8.9 g/dL — ABNORMAL LOW (ref 12.0–15.0)
MCH: 26.1 pg (ref 26.0–34.0)
MCHC: 33.7 g/dL (ref 30.0–36.0)
MCV: 77.4 fL — ABNORMAL LOW (ref 80.0–100.0)
Platelets: 236 K/uL (ref 150–400)
RBC: 3.41 MIL/uL — ABNORMAL LOW (ref 3.87–5.11)
RDW: 12.6 % (ref 11.5–15.5)
WBC: 10.8 K/uL — ABNORMAL HIGH (ref 4.0–10.5)
nRBC: 0 % (ref 0.0–0.2)

## 2024-06-10 LAB — PROTIME-INR
INR: 1.1 (ref 0.8–1.2)
Prothrombin Time: 14.4 s (ref 11.4–15.2)

## 2024-06-10 LAB — FIBRINOGEN: Fibrinogen: 459 mg/dL (ref 210–475)

## 2024-06-10 LAB — APTT: aPTT: 27 s (ref 24–36)

## 2024-06-10 MED ORDER — OXYCODONE HCL 5 MG PO TABS
5.0000 mg | ORAL_TABLET | ORAL | Status: DC | PRN
  Administered 2024-06-10 (×3): 10 mg via ORAL
  Administered 2024-06-11 (×2): 5 mg via ORAL
  Administered 2024-06-12 – 2024-06-14 (×10): 10 mg via ORAL
  Filled 2024-06-10 (×15): qty 2
  Filled 2024-06-10: qty 1
  Filled 2024-06-10: qty 2

## 2024-06-10 MED ORDER — ENOXAPARIN SODIUM 40 MG/0.4ML IJ SOSY
40.0000 mg | PREFILLED_SYRINGE | Freq: Once | INTRAMUSCULAR | Status: AC
  Administered 2024-06-10: 40 mg via SUBCUTANEOUS
  Filled 2024-06-10: qty 0.4

## 2024-06-10 MED ORDER — HYDROXYZINE HCL 10 MG PO TABS
10.0000 mg | ORAL_TABLET | Freq: Three times a day (TID) | ORAL | Status: DC | PRN
  Administered 2024-06-10: 10 mg via ORAL
  Filled 2024-06-10: qty 1

## 2024-06-10 MED ORDER — CEFAZOLIN SODIUM-DEXTROSE 2-4 GM/100ML-% IV SOLN
2.0000 g | INTRAVENOUS | Status: AC
  Administered 2024-06-11: 2 g via INTRAVENOUS
  Filled 2024-06-10 (×2): qty 100

## 2024-06-10 MED ORDER — ACETAMINOPHEN 500 MG PO TABS
1000.0000 mg | ORAL_TABLET | Freq: Four times a day (QID) | ORAL | Status: DC
  Administered 2024-06-10 – 2024-06-14 (×15): 1000 mg via ORAL
  Filled 2024-06-10 (×16): qty 2

## 2024-06-10 MED ADMIN — Hydroxyzine HCl Tab 50 MG: 50 mg | ORAL | NDC 16714008310

## 2024-06-10 MED FILL — Hydroxyzine HCl Tab 50 MG: 50.0000 mg | ORAL | Qty: 1 | Status: CN

## 2024-06-10 MED FILL — Hydroxyzine HCl Tab 50 MG: 50.0000 mg | ORAL | Qty: 1 | Status: AC

## 2024-06-10 NOTE — Plan of Care (Signed)
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
## 2024-06-10 NOTE — TOC CAGE-AID Note (Signed)
 Transition of Care [facility]) - CAGE-AID Screening  Patient Details  Name: Nicole Branch MRN: [id number] Date of Birth: Dec 12, 2000  Clinical Narrative:  Patient denies any current alcohol or drug use. Does endorse vape smoking up until a month ago.  CAGE-AID Screening:   Have You Ever Felt You Ought to Cut Down on Your Drinking or Drug Use?: No Have People Annoyed You By Critizing Your Drinking Or Drug Use?: No Have You Felt Bad Or Guilty About Your Drinking Or Drug Use?: No Have You Ever Had a Drink or Used Drugs First Thing In The Morning to Steady Your Nerves or to Get Rid of a Hangover?: No CAGE-AID Score: 0  Substance Abuse Education Offered: No

## 2024-06-11 ENCOUNTER — Other Ambulatory Visit: Payer: Self-pay

## 2024-06-11 ENCOUNTER — Encounter (HOSPITAL_COMMUNITY): Payer: Self-pay | Admitting: Obstetrics and Gynecology

## 2024-06-11 ENCOUNTER — Inpatient Hospital Stay (HOSPITAL_COMMUNITY)

## 2024-06-11 ENCOUNTER — Encounter (HOSPITAL_COMMUNITY)
Admission: EM | Disposition: A | Discharge status: Home / Self Care | Payer: Self-pay | Source: Home / Self Care | Attending: Obstetrics & Gynecology

## 2024-06-11 ENCOUNTER — Inpatient Hospital Stay (HOSPITAL_COMMUNITY): Payer: Self-pay | Admitting: Certified Registered Nurse Anesthetist

## 2024-06-11 MED ORDER — ENOXAPARIN SODIUM 40 MG/0.4ML IJ SOSY
40.0000 mg | PREFILLED_SYRINGE | INTRAMUSCULAR | Status: DC
  Administered 2024-06-12 – 2024-06-14 (×3): 40 mg via SUBCUTANEOUS
  Filled 2024-06-11 (×3): qty 0.4

## 2024-06-11 MED ORDER — ONDANSETRON HCL 4 MG/2ML IJ SOLN
INTRAMUSCULAR | Status: DC | PRN
  Administered 2024-06-11: 4 mg via INTRAVENOUS

## 2024-06-11 MED ORDER — ONDANSETRON HCL 4 MG/2ML IJ SOLN: 4.0000 mg | Freq: Four times a day (QID) | INTRAMUSCULAR | Status: DC | PRN

## 2024-06-11 MED ORDER — 0.9 % SODIUM CHLORIDE (POUR BTL) OPTIME
TOPICAL | Status: DC | PRN
  Administered 2024-06-11: 1000 mL

## 2024-06-11 MED ORDER — LACTATED RINGERS IV BOLUS
500.0000 mL | Freq: Once | INTRAVENOUS | Status: AC
  Administered 2024-06-11: 500 mL via INTRAVENOUS

## 2024-06-11 MED ORDER — CEFAZOLIN SODIUM-DEXTROSE 2-4 GM/100ML-% IV SOLN
2.0000 g | Freq: Three times a day (TID) | INTRAVENOUS | Status: AC
  Administered 2024-06-11 – 2024-06-12 (×3): 2 g via INTRAVENOUS
  Filled 2024-06-11 (×3): qty 100

## 2024-06-11 MED ORDER — ONDANSETRON HCL 4 MG/2ML IJ SOLN
INTRAMUSCULAR | Status: AC
  Filled 2024-06-11: qty 2

## 2024-06-11 MED ORDER — POVIDONE-IODINE 10 % EX SWAB: 2.0000 | Freq: Once | CUTANEOUS | Status: DC

## 2024-06-11 MED ORDER — LACTATED RINGERS IV SOLN: INTRAVENOUS | Status: DC

## 2024-06-11 MED ORDER — METOCLOPRAMIDE HCL 5 MG PO TABS
5.0000 mg | ORAL_TABLET | Freq: Three times a day (TID) | ORAL | Status: DC | PRN
  Filled 2024-06-11: qty 2

## 2024-06-11 MED ORDER — ROPIVACAINE HCL 5 MG/ML IJ SOLN
INTRAMUSCULAR | Status: DC | PRN
  Administered 2024-06-11: 30 mL via PERINEURAL
  Administered 2024-06-11: 15 mL via PERINEURAL

## 2024-06-11 MED ORDER — METOCLOPRAMIDE HCL 5 MG/ML IJ SOLN: 5.0000 mg | Freq: Three times a day (TID) | INTRAMUSCULAR | Status: DC | PRN

## 2024-06-11 MED ORDER — DEXMEDETOMIDINE HCL IN NACL 80 MCG/20ML IV SOLN
INTRAVENOUS | Status: DC | PRN
  Administered 2024-06-11 (×4): 4 ug via INTRAVENOUS

## 2024-06-11 MED ORDER — VANCOMYCIN HCL 1000 MG IV SOLR
INTRAVENOUS | Status: DC | PRN
  Administered 2024-06-11: 1000 mg via TOPICAL

## 2024-06-11 MED ORDER — CHLORHEXIDINE GLUCONATE 4 % EX SOLN
60.0000 mL | Freq: Once | CUTANEOUS | Status: AC
  Administered 2024-06-11: 4 via TOPICAL
  Filled 2024-06-11: qty 60

## 2024-06-11 MED ORDER — CLONIDINE HCL (ANALGESIA) 100 MCG/ML EP SOLN
EPIDURAL | Status: DC | PRN
  Administered 2024-06-11 (×2): 100 ug

## 2024-06-11 MED ORDER — CEFAZOLIN SODIUM-DEXTROSE 2-4 GM/100ML-% IV SOLN: 2.0000 g | INTRAVENOUS | Status: DC

## 2024-06-11 MED ORDER — ONDANSETRON HCL 4 MG PO TABS: 4.0000 mg | ORAL_TABLET | Freq: Four times a day (QID) | ORAL | Status: DC | PRN

## 2024-06-11 MED ORDER — MIDAZOLAM HCL 2 MG/2ML IJ SOLN
INTRAMUSCULAR | Status: AC
  Filled 2024-06-11: qty 2

## 2024-06-11 MED ORDER — VANCOMYCIN HCL 1000 MG IV SOLR
INTRAVENOUS | Status: AC
  Filled 2024-06-11: qty 20

## 2024-06-11 MED ORDER — FENTANYL CITRATE (PF) 100 MCG/2ML IJ SOLN
INTRAMUSCULAR | Status: AC
  Administered 2024-06-11: 50 ug
  Filled 2024-06-11: qty 2

## 2024-06-11 MED ORDER — CHLORHEXIDINE GLUCONATE 0.12 % MT SOLN: 15.0000 mL | Freq: Once | OROMUCOSAL | Status: AC

## 2024-06-11 MED ORDER — ORAL CARE MOUTH RINSE: 15.0000 mL | Freq: Once | OROMUCOSAL | Status: AC

## 2024-06-11 MED ADMIN — Hydroxyzine HCl Tab 50 MG: 50 mg | ORAL | NDC 16714008310

## 2024-06-11 MED ADMIN — Chlorhexidine Gluconate Soln 0.12%: 15 mL | OROMUCOSAL | NDC 00121089300

## 2024-06-11 MED ADMIN — Fentanyl Citrate Preservative Free (PF) Inj 100 MCG/2ML: 100 ug | INTRAVENOUS | NDC 72572017001

## 2024-06-11 MED ADMIN — Fentanyl Citrate Preservative Free (PF) Inj 100 MCG/2ML: 50 ug | INTRAVENOUS | NDC 72572017001

## 2024-06-11 MED FILL — Chlorhexidine Gluconate Soln 0.12%: 15.0000 mL | OROMUCOSAL | Qty: 15 | Status: AC

## 2024-06-11 MED FILL — Fentanyl Citrate Preservative Free (PF) Inj 100 MCG/2ML: 50.0000 ug | INTRAMUSCULAR | Qty: 2 | Status: AC

## 2024-06-11 SURGICAL SUPPLY — 2 items
NDL HYPO 21X1.5 SAFETY (NEEDLE) IMPLANT
NDL HYPO 25GX1X1/2 BEV (NEEDLE) ×1 IMPLANT

## 2024-06-12 MED ORDER — BACITRACIN ZINC 500 UNIT/GM EX OINT
TOPICAL_OINTMENT | Freq: Two times a day (BID) | CUTANEOUS | Status: DC
  Administered 2024-06-12 – 2024-06-14 (×4): 31.1111 via TOPICAL
  Filled 2024-06-12: qty 28.4

## 2024-06-12 MED ORDER — VITAMIN D 25 MCG (1000 UNIT) PO TABS
2000.0000 [IU] | ORAL_TABLET | Freq: Every day | ORAL | Status: DC
  Administered 2024-06-12 – 2024-06-14 (×3): 2000 [IU] via ORAL
  Filled 2024-06-12 (×3): qty 2

## 2024-06-12 MED ORDER — DIPHENOXYLATE-ATROPINE 2.5-0.025 MG PO TABS
1.0000 | ORAL_TABLET | Freq: Four times a day (QID) | ORAL | Status: DC | PRN
  Administered 2024-06-12: 1 via ORAL
  Filled 2024-06-12 (×2): qty 1

## 2024-06-12 MED ORDER — SODIUM CHLORIDE 0.9% FLUSH
3.0000 mL | INTRAVENOUS | Status: DC | PRN
  Administered 2024-06-12 – 2024-06-13 (×3): 3 mL via INTRAVENOUS

## 2024-06-12 MED ORDER — SODIUM CHLORIDE 0.9% FLUSH: 3.0000 mL | Freq: Two times a day (BID) | INTRAVENOUS | Status: DC

## 2024-06-12 MED ORDER — SODIUM CHLORIDE 0.9% FLUSH
3.0000 mL | Freq: Two times a day (BID) | INTRAVENOUS | Status: DC
  Administered 2024-06-12 – 2024-06-14 (×4): 3 mL via INTRAVENOUS

## 2024-06-12 MED ORDER — SODIUM CHLORIDE 0.9% FLUSH: 3.0000 mL | INTRAVENOUS | Status: DC | PRN

## 2024-06-12 MED ADMIN — Hydroxyzine HCl Tab 50 MG: 50 mg | ORAL | NDC 16714008310

## 2024-06-12 MED ADMIN — Fentanyl Citrate Preservative Free (PF) Inj 100 MCG/2ML: 100 ug | INTRAVENOUS | NDC 72572017001

## 2024-06-13 DIAGNOSIS — O99013 Anemia complicating pregnancy, third trimester: Secondary | ICD-10-CM | POA: Diagnosis present

## 2024-06-13 DIAGNOSIS — O9A213 Injury, poisoning and certain other consequences of external causes complicating pregnancy, third trimester: Secondary | ICD-10-CM | POA: Diagnosis present

## 2024-06-13 DIAGNOSIS — Z3A30 30 weeks gestation of pregnancy: Secondary | ICD-10-CM

## 2024-06-13 DIAGNOSIS — S82202A Unspecified fracture of shaft of left tibia, initial encounter for closed fracture: Secondary | ICD-10-CM | POA: Diagnosis present

## 2024-06-13 DIAGNOSIS — N762 Acute vulvitis: Secondary | ICD-10-CM | POA: Diagnosis not present

## 2024-06-13 MED ORDER — CEFADROXIL 500 MG PO CAPS
500.0000 mg | ORAL_CAPSULE | Freq: Two times a day (BID) | ORAL | Status: DC
  Administered 2024-06-14 (×2): 500 mg via ORAL
  Filled 2024-06-13 (×3): qty 1

## 2024-06-13 MED ORDER — FERROUS SULFATE 325 (65 FE) MG PO TABS
325.0000 mg | ORAL_TABLET | ORAL | Status: DC
  Administered 2024-06-13: 325 mg via ORAL
  Filled 2024-06-13: qty 1

## 2024-06-13 MED ADMIN — Hydroxyzine HCl Tab 50 MG: 50 mg | ORAL | NDC 16714008310

## 2024-06-13 MED ADMIN — Fentanyl Citrate Preservative Free (PF) Inj 100 MCG/2ML: 100 ug | INTRAVENOUS | NDC 72572017001

## 2024-06-14 ENCOUNTER — Other Ambulatory Visit (HOSPITAL_COMMUNITY): Payer: Self-pay

## 2024-06-14 DIAGNOSIS — O284 Abnormal radiological finding on antenatal screening of mother: Secondary | ICD-10-CM | POA: Diagnosis not present

## 2024-06-14 DIAGNOSIS — Z3A30 30 weeks gestation of pregnancy: Secondary | ICD-10-CM

## 2024-06-14 DIAGNOSIS — O9A213 Injury, poisoning and certain other consequences of external causes complicating pregnancy, third trimester: Secondary | ICD-10-CM

## 2024-06-14 MED ORDER — ENOXAPARIN SODIUM 40 MG/0.4ML IJ SOSY
40.0000 mg | PREFILLED_SYRINGE | INTRAMUSCULAR | 1 refills | Status: DC
  Filled 2024-06-14: qty 12, 30d supply, fill #0

## 2024-06-14 MED ORDER — CEFADROXIL 500 MG PO CAPS
500.0000 mg | ORAL_CAPSULE | Freq: Two times a day (BID) | ORAL | 0 refills | Status: AC
  Filled 2024-06-14: qty 12, 6d supply, fill #0

## 2024-06-14 MED ORDER — FERROUS SULFATE 325 (65 FE) MG PO TABS
325.0000 mg | ORAL_TABLET | ORAL | 0 refills | Status: DC
  Filled 2024-06-14: qty 60, 120d supply, fill #0

## 2024-06-14 MED ORDER — VITAMIN D3 25 MCG PO TABS
2000.0000 [IU] | ORAL_TABLET | Freq: Every day | ORAL | 0 refills | Status: DC
  Filled 2024-06-14: qty 180, 90d supply, fill #0

## 2024-06-14 MED ORDER — OXYCODONE HCL 5 MG PO TABS
5.0000 mg | ORAL_TABLET | ORAL | 0 refills | Status: DC | PRN
  Filled 2024-06-14: qty 30, 3d supply, fill #0

## 2024-06-14 MED ORDER — DOCUSATE SODIUM 100 MG PO CAPS
100.0000 mg | ORAL_CAPSULE | Freq: Every day | ORAL | 0 refills | Status: DC
  Filled 2024-06-14: qty 10, 10d supply, fill #0

## 2024-06-14 MED ORDER — ACETAMINOPHEN 500 MG PO TABS
1000.0000 mg | ORAL_TABLET | Freq: Three times a day (TID) | ORAL | 0 refills | Status: AC | PRN
  Filled 2024-06-14: qty 30, 5d supply, fill #0

## 2024-06-14 MED ORDER — PREPLUS 27-1 MG PO TABS
1.0000 | ORAL_TABLET | Freq: Every day | ORAL | 13 refills | Status: DC
  Filled 2024-06-14: qty 30, 30d supply, fill #0

## 2024-06-14 MED ORDER — ENOXAPARIN (LOVENOX) PATIENT EDUCATION KIT
PACK | Freq: Once | Status: DC
  Filled 2024-06-14: qty 1

## 2024-06-16 ENCOUNTER — Encounter (HOSPITAL_COMMUNITY): Payer: Self-pay | Admitting: Student

## 2024-06-22 ENCOUNTER — Ambulatory Visit (HOSPITAL_BASED_OUTPATIENT_CLINIC_OR_DEPARTMENT_OTHER): Admitting: Student

## 2024-07-22 NOTE — L&D Delivery Note (Signed)
 Delivery Note  Date of delivery: 08/20/2024 Estimated Date of Delivery: 08/20/24 Patient's last menstrual period was 11/14/2023 (exact date). EGA: [redacted]w[redacted]d  Delivery Note At 7:26 PM a viable female was delivered via Vaginal, Spontaneous (Presentation: Right Occiput Anterior).  APGAR: 8, 9; weight  pending.  Placenta status: Spontaneous, Intact.  Cord: 3 vessels with the following complications: Knot.  Cord pH: n/a  First Stage: Labor onset: unknown Induction : Cytotec , Pitocin  Analgesia Holli intrapartum: Epidural SROM at 0815  Allean JONETTA Oz presented to L&D with IUGR. She was Induced with cytotec , pitocin , and [employer]. Epidural placed for pain relief.   Second Stage: Complete dilation at 1825 Onset of pushing at 1825 FHR second stage Cat I, Cat II Delivery at 1926 on 08/20/2024  She progressed to complete and had a spontaneous vaginal birth of a live female over an intact perineum. The fetal head was delivered in OA position with restitution to ROA. No nuchal cord. Anterior then posterior shoulders delivered spontaneously with minimal assistance. Baby placed on mom's abdomen and attended to by transition RN. Cord clamped and cut when pulseless by FOB. Cord blood obtained for newborn labs.  Third Stage: Placenta delivered intact with 3VC at 1936 Placenta disposition: Pathology Uterine tone firm / bleeding mod - TXA given with resolution IV pitocin  given for hemorrhage prophylaxis  Anesthesia: Epidural Episiotomy: None Lacerations: Periurethral Suture Repair: 2.0 3.0 vicryl Est. Blood Loss (mL):  400  Complications: none  Mom to postpartum.  Baby to Couplet care / Skin to Skin.  Newborn: Birth Weight: pending  Apgar Scores: 8, 9 Feeding planned: Bottle feeding   Margery FORBES Coe, CNM 08/20/2024 8:13 PM

## 2024-07-27 NOTE — Progress Notes (Signed)
 Subjective Patient ID: Nicole Branch is a 24 y.o. female.    Patient is a 24 year old female who is [redacted] weeks pregnant  presents to the clinic for flulike symptoms.  Patient reports of runny nose and congestion, mild sore throat and bodyaches that has been going on since past 2 to 3 days.  Patient states that she felt feverish, she measured her temperature at the Tmax was 99.2.  Has not been taking any medications.  Denies any cough.  Denies any chest pain or shortness of breath.  Denies any abdominal pain nausea vomiting diarrhea.  Denies any concern for pregnancy.  Denies any  abdominal contraction, UTI symptoms, vaginal bleeding, discharge or other complaints.  Patient states the partner also had runny nose and congestion couple of days ago.     Review of Systems  Constitutional:  Negative for chills, diaphoresis, fever and unexpected weight change.  HENT:  Positive for congestion, rhinorrhea and sore throat. Negative for postnasal drip, sinus pressure, sinus pain, sneezing, trouble swallowing and voice change.   Respiratory:  Negative for cough, chest tightness, shortness of breath, wheezing and stridor.   Cardiovascular:  Negative for chest pain and leg swelling.  Gastrointestinal:  Negative for abdominal pain, nausea and vomiting.  Genitourinary:  Negative for dysuria, hematuria, vaginal bleeding, vaginal discharge and vaginal pain.  Musculoskeletal:  Negative for arthralgias, back pain and myalgias.  Skin:  Negative for rash.  Allergic/Immunologic: Negative for environmental allergies.  Neurological:  Negative for dizziness, weakness and headaches.  Hematological:  Negative for adenopathy.  Psychiatric/Behavioral:  Negative for agitation and sleep disturbance. The patient is not nervous/anxious.     Patient History  Allergies: Allergies  Allergen Reactions   Penicillins Hives and Rash    Tolerated IV ancef . No adverse reactions    History reviewed. No pertinent past  medical history. History reviewed. No pertinent surgical history. Social History   Socioeconomic History   Marital status: Single    Spouse name: Not on file   Number of children: Not on file   Years of education: Not on file   Highest education level: Not on file  Occupational History   Not on file  Tobacco Use   Smoking status: Former    Types: Cigarettes   Smokeless tobacco: Former  Substance and Sexual Activity   Alcohol use: Not on file   Drug use: Not on file   Sexual activity: Not on file  Other Topics Concern   Not on file  Social History Narrative   Not on file   History reviewed. No pertinent family history. Current Outpatient Medications on File Prior to Visit  Medication Sig Dispense Refill   Prenat w/o A-FE-Methfol-FA-DHA (PNV-DHA) 27-0.6-0.4-300 MG capsule Take 1 capsule by mouth 1 (one) time each day.     No current facility-administered medications on file prior to visit.    Objective  Vitals:   07/27/24 0842  BP: 102/67  Pulse: 97  Resp: 18  Temp: 37.1 C (98.7 F)  TempSrc: Tympanic  SpO2: 97%  Weight: 68 kg  Height: 5' 3  PainSc: 0-No pain        OBGYN/Pregnancy Status: Pregnant      No results found.  Physical Exam Vitals and nursing note reviewed.  Constitutional:      General: She is not in acute distress.    Appearance: Normal appearance. She is not ill-appearing or toxic-appearing.  HENT:     Head: Normocephalic.     Nose: Congestion and rhinorrhea  present.     Mouth/Throat:     Mouth: Mucous membranes are moist.     Pharynx: Posterior oropharyngeal erythema present. No oropharyngeal exudate.     Comments: No exudates. No drooling or trismus. Normal phonation. No oropharyngeal swelling. Tolerating oral secretions w/o difficulty.  Eyes:     Extraocular Movements: Extraocular movements intact.     Pupils: Pupils are equal, round, and reactive to light.  Cardiovascular:     Rate and Rhythm: Normal rate and  regular rhythm.  Pulmonary:     Effort: Pulmonary effort is normal. No respiratory distress.     Breath sounds: Normal breath sounds. No wheezing, rhonchi or rales.  Abdominal:     Palpations: Abdomen is soft.     Tenderness: There is no abdominal tenderness. There is no right CVA tenderness or left CVA tenderness.     Comments: Gravid abdomen  Musculoskeletal:        General: Normal range of motion.     Cervical back: Normal range of motion.  Skin:    General: Skin is warm.     Capillary Refill: Capillary refill takes less than 2 seconds.     Findings: No rash.  Neurological:     General: No focal deficit present.     Mental Status: She is alert and oriented to person, place, and time.     Comments: Moving all extrimities  Psychiatric:        Mood and Affect: Mood normal.        Behavior: Behavior normal.     Results for orders placed or performed in visit on 07/27/24  POCT RAPID INFLUENZA MCKESSON  Component Result   Rapid Influenza A Ag Negative   Rapid Influenza B Ag Negative   Internal Quality Control Pass  POCT rapid strep A  Component Result   Rapid Strep A Screen Negative   Internal Quality Control Pass  POCT QuickVue Antigen Test  Component Result   Rapid Covid Negative   Internal Quality Control Pass       Procedures MDM:     1 Acute, uncomplicated illness or injury     Explanation of Medical Decision Making and variances from expected care:  H&P as above.  Vital signs are stable.  No acute distress.  Nontoxic-appearing.  No respiratory distress.  Lungs are clear to auscultate.  COVID flu strep is negative.  Strep culture was sent.  Likely viral URI denies any OB/GYN complaints.  Advise close follow with her PCP and GYN.  no indication of medication at this time.  Strict ER precautions were provided.  over-the-counter medication for symptom advised over-the-counter medication for symptomatic relief Patient has remained hemodynamically stable during her  entire UC clinic stay. Patient stable for discharge.  Discharge instructions are discussed with the patient and they verbalized understanding. All questions were answered prior to the patient discharge from the clinic. Strict ED precautions discussed with patient. Patient states understanding and agrees to plan. Patient discharged home in no acute distress and stable vitals.    Review of any test results: Three+     Assessment requiring historian other than patient: No     Independent visualization of image, tracing, or test: No     Discussion of management with another provider: No            Assessment/Plan Diagnoses and all orders for this visit:  Viral URI -     Beta Strep Gp A Culture  Sore throat (viral) -  Beta Strep Gp A Culture  Other orders -     POCT RAPID INFLUENZA MCKESSON -     POCT rapid strep A -     POCT QuickVue Antigen Test     Disposition Status: Home  Patient Instructions  Please drink fluids and rest.  Take over-the-counter medications for symptomatic relief including Tylenol .  Please follow with your primary care doctor and GYN.  Go to the ER symptoms worsen  Progress note signed by Gordy Queen, PA on 07/27/24 at  9:24 AM

## 2024-07-27 NOTE — Progress Notes (Signed)
 Nicole Branch is a 24 y.o. female pt co [redacted] weeks pregnant, fatigue, sore throat, fever, congestion, 2 x days.

## 2024-08-05 LAB — OB RESULTS CONSOLE GC/CHLAMYDIA
Chlamydia: NEGATIVE
Neisseria Gonorrhea: NEGATIVE

## 2024-08-15 ENCOUNTER — Other Ambulatory Visit: Payer: Self-pay | Admitting: Obstetrics

## 2024-08-15 DIAGNOSIS — Z3490 Encounter for supervision of normal pregnancy, unspecified, unspecified trimester: Secondary | ICD-10-CM
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
## 2024-08-19 ENCOUNTER — Other Ambulatory Visit: Payer: Self-pay

## 2024-08-19 ENCOUNTER — Inpatient Hospital Stay
Admission: RE | Admit: 2024-08-19 | Discharge: 2024-08-22 | Disposition: A | Attending: Obstetrics | Admitting: Obstetrics

## 2024-08-19 ENCOUNTER — Encounter: Payer: Self-pay | Admitting: Obstetrics and Gynecology

## 2024-08-19 DIAGNOSIS — O99013 Anemia complicating pregnancy, third trimester: Principal | ICD-10-CM

## 2024-08-19 DIAGNOSIS — Z3490 Encounter for supervision of normal pregnancy, unspecified, unspecified trimester: Secondary | ICD-10-CM | POA: Diagnosis present

## 2024-08-19 DIAGNOSIS — O9A213 Injury, poisoning and certain other consequences of external causes complicating pregnancy, third trimester: Secondary | ICD-10-CM

## 2024-08-19 DIAGNOSIS — Z3A30 30 weeks gestation of pregnancy: Secondary | ICD-10-CM

## 2024-08-19 LAB — CBC
HCT: 30.6 % — ABNORMAL LOW (ref 36.0–46.0)
Hemoglobin: 10.1 g/dL — ABNORMAL LOW (ref 12.0–15.0)
MCH: 24.6 pg — ABNORMAL LOW (ref 26.0–34.0)
MCHC: 33 g/dL (ref 30.0–36.0)
MCV: 74.6 fL — ABNORMAL LOW (ref 80.0–100.0)
Platelets: 329 10*3/uL (ref 150–400)
RBC: 4.1 MIL/uL (ref 3.87–5.11)
RDW: 15.1 % (ref 11.5–15.5)
WBC: 11.3 10*3/uL — ABNORMAL HIGH (ref 4.0–10.5)
nRBC: 0 % (ref 0.0–0.2)

## 2024-08-19 LAB — TYPE AND SCREEN
ABO/RH(D): O POS
Antibody Screen: NEGATIVE

## 2024-08-19 LAB — URINE DRUG SCREEN
Amphetamines: NEGATIVE
Barbiturates: NEGATIVE
Benzodiazepines: NEGATIVE
Cocaine: NEGATIVE
Fentanyl: NEGATIVE
Methadone Scn, Ur: NEGATIVE
Opiates: NEGATIVE
Tetrahydrocannabinol: NEGATIVE

## 2024-08-19 MED ORDER — MISOPROSTOL 25 MCG QUARTER TABLET
25.0000 ug | ORAL_TABLET | ORAL | Status: DC
  Administered 2024-08-19 (×2): 25 ug via VAGINAL
  Filled 2024-08-19 (×2): qty 1

## 2024-08-19 MED ORDER — OXYTOCIN 10 UNIT/ML IJ SOLN
INTRAMUSCULAR | Status: AC
  Filled 2024-08-19: qty 2

## 2024-08-19 MED ORDER — OXYTOCIN-SODIUM CHLORIDE 30-0.9 UT/500ML-% IV SOLN
2.5000 [IU]/h | INTRAVENOUS | Status: DC
  Filled 2024-08-19: qty 500

## 2024-08-19 MED ORDER — CALCIUM CARBONATE ANTACID 500 MG PO CHEW
CHEWABLE_TABLET | ORAL | Status: AC
  Administered 2024-08-19: 400 mg via ORAL
  Filled 2024-08-19: qty 2

## 2024-08-19 MED ORDER — MISOPROSTOL 25 MCG QUARTER TABLET
25.0000 ug | ORAL_TABLET | ORAL | Status: DC
  Administered 2024-08-19 (×2): 25 ug via ORAL
  Filled 2024-08-19 (×2): qty 1

## 2024-08-19 MED ORDER — FENTANYL CITRATE (PF) 100 MCG/2ML IJ SOLN
50.0000 ug | INTRAMUSCULAR | Status: DC | PRN
  Administered 2024-08-19: 50 ug via INTRAVENOUS
  Filled 2024-08-19: qty 2

## 2024-08-19 MED ORDER — CALCIUM CARBONATE ANTACID 500 MG PO CHEW
2.0000 | CHEWABLE_TABLET | Freq: Every day | ORAL | Status: DC
  Administered 2024-08-19: 400 mg via ORAL
  Filled 2024-08-19: qty 2

## 2024-08-19 MED ORDER — ONDANSETRON HCL 4 MG/2ML IJ SOLN: 4.0000 mg | Freq: Four times a day (QID) | INTRAMUSCULAR | Status: DC | PRN

## 2024-08-19 MED ORDER — OXYTOCIN-SODIUM CHLORIDE 30-0.9 UT/500ML-% IV SOLN
1.0000 m[IU]/min | INTRAVENOUS | Status: DC
  Administered 2024-08-20: 2 m[IU]/min via INTRAVENOUS

## 2024-08-19 MED ORDER — TERBUTALINE SULFATE 1 MG/ML IJ SOLN: 0.2500 mg | Freq: Once | INTRAMUSCULAR | Status: DC | PRN

## 2024-08-19 MED ORDER — LIDOCAINE HCL (PF) 1 % IJ SOLN
INTRAMUSCULAR | Status: AC
  Filled 2024-08-19: qty 30

## 2024-08-19 MED ORDER — LACTATED RINGERS IV SOLN: INTRAVENOUS | Status: AC

## 2024-08-19 MED ORDER — LIDOCAINE HCL (PF) 1 % IJ SOLN
30.0000 mL | INTRAMUSCULAR | Status: AC | PRN
  Administered 2024-08-20: 30 mL via SUBCUTANEOUS

## 2024-08-19 MED ORDER — OXYTOCIN BOLUS FROM INFUSION
333.0000 mL | Freq: Once | INTRAVENOUS | Status: AC
  Administered 2024-08-20: 333 mL via INTRAVENOUS

## 2024-08-19 MED ORDER — SOD CITRATE-CITRIC ACID 500-334 MG/5ML PO SOLN: 30.0000 mL | ORAL | Status: DC | PRN

## 2024-08-19 MED ORDER — LACTATED RINGERS IV SOLN
500.0000 mL | INTRAVENOUS | Status: AC | PRN
  Administered 2024-08-19: 500 mL via INTRAVENOUS
  Administered 2024-08-19: 250 mL via INTRAVENOUS
  Administered 2024-08-20: 500 mL via INTRAVENOUS

## 2024-08-19 MED ORDER — AMMONIA AROMATIC IN INHA
RESPIRATORY_TRACT | Status: AC
  Filled 2024-08-19: qty 10

## 2024-08-19 MED ORDER — ACETAMINOPHEN 325 MG PO TABS
650.0000 mg | ORAL_TABLET | ORAL | Status: DC | PRN
  Administered 2024-08-20 (×2): 650 mg via ORAL
  Filled 2024-08-19 (×2): qty 2

## 2024-08-19 MED ORDER — MISOPROSTOL 200 MCG PO TABS
ORAL_TABLET | ORAL | Status: AC
  Filled 2024-08-19: qty 4

## 2024-08-19 MED ORDER — DEXTROSE 5 % IN LACTATED RINGERS IV BOLUS
500.0000 mL | Freq: Once | INTRAVENOUS | Status: AC
  Administered 2024-08-19: 500 mL via INTRAVENOUS
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
## 2024-08-19 NOTE — Progress Notes (Signed)
 PT Cancellation Note  Patient Details Name: Nicole Branch MRN: [id number] DOB: [birth year]   Cancelled Treatment:    Reason Eval/Treat Not Completed: PT screened, no needs identified, will sign off Brought patient a walker to use while here. She states she has been using on at home. No education needed. Signing off.   Randee Huston 08/19/2024, 11:13 AM

## 2024-08-19 NOTE — Progress Notes (Signed)
 Pt continuing to contract too frequent for redose of cytotec . Will continue to monitor ctx pattern and redose when appropriate. Ivonne Freeburg L. Reyhan Moronta, RN BSN 08/19/2024 11:02 PM

## 2024-08-19 NOTE — Progress Notes (Signed)
 Stefano Igo, RN called Tanda, CNM because pt wanting to get up to shower and take monitors off. Tanda, CNM approved pt request to take monitors off for shower. Instructed pt to call out when she is done showering so we could put EFM back on. Tilman Mcclaren L. Caiden Monsivais, RN BSN 08/19/2024 7:15 PM

## 2024-08-19 NOTE — Progress Notes (Signed)
 Labor Progress Note  Nicole Branch is a 24 y.o. G1P0000 at [redacted]w[redacted]d by LMP admitted for induction of labor due to IUGR.  Subjective: she reports mild cramping, requesting to take a shower  Objective: BP 122/87 (BP Location: Left Arm)   Pulse 77   Temp 98.1 F (36.7 C) (Oral)   Resp 18   Ht 5' 2 (1.575 m)   Wt 74.4 kg   LMP 11/14/2023 (Exact Date)   BMI 30.00 kg/m  Notable VS details: reviewed  Fetal Assessment: FHT:  FHR: 140 bpm, variability: moderate,  accelerations:  Present,  decelerations:  Absent Category/reactivity:  Category I UC:   irregular, every 5-8 minutes SVE:    Dilation: Closed  Effacement: Long  Station:  Floating  Consistency: soft  Position: posterior  Membrane status:intact Amniotic color: n/a  Labs: Lab Results  Component Value Date   WBC 11.3 (H) 08/19/2024   HGB 10.1 (L) 08/19/2024   HCT 30.6 (L) 08/19/2024   MCV 74.6 (L) 08/19/2024   PLT 329 08/19/2024    Assessment / Plan: 23 year old G1P0 at [redacted]w[redacted]d with IOL for IUGR  Labor: Received second dose of vaginal/oral cytotec  around 1700, she is starting to feel some cramping. Once 1cm will attempt to place [facility] catheter. Normal early induction. Can get up to the shower.  Preeclampsia:  labs stable Fetal Wellbeing:  Category I Pain Control:  will request epidural when ready I/D:  GBS negative Anticipated MOD:  NSVD  [name], CNM 08/19/2024, 7:09 PM

## 2024-08-19 NOTE — Progress Notes (Signed)
 Labor Progress Note  Nicole Branch is a 24 y.o. G1P0000 at [redacted]w[redacted]d by LMP admitted for induction of labor due to IUGR.  Subjective: patient is reporting 7/10 pain and received IV fentanyl , RN held latest dose of cytotec  d/t frequent contractions, requested for fetal heart rate tracing to be evaluated,   Objective: BP 128/76 (BP Location: Right Arm)   Pulse (!) 104   Temp 98.2 F (36.8 C) (Oral)   Resp 16   Ht 5' 2 (1.575 m)   Wt 74.4 kg   LMP 11/14/2023 (Exact Date)   BMI 30.00 kg/m  Notable VS details: reviewed  Fetal Assessment: FHT:  FHR: 140 bpm, variability: minimal and moderate,  accelerations:  Abscent,  decelerations:  Absent Category/reactivity:  Category II UC:   irregular, every 1-4 minutes SVE:   per RN Exam Dilation: Closed  Effacement: Long  Station:  -3  Consistency: soft  Position: posterior  Membrane status:intact Amniotic color: n/a  Labs: Lab Results  Component Value Date   WBC 11.3 (H) 08/19/2024   HGB 10.1 (L) 08/19/2024   HCT 30.6 (L) 08/19/2024   MCV 74.6 (L) 08/19/2024   PLT 329 08/19/2024    Assessment / Plan: 24 year old G1P0 at [redacted]w[redacted]d with IOL for IUGR  Labor: s/p 2 doses vaginal/oral cytotec , last dose around 1700. Patient was contracting frequently around 2100 so RN held latest dose of cytotec . Patient was still closed. Due to category II fetal heart tracing, will hold cytotec  and start pitocin  augmentation once category I tracing. Unable to place [place] catheter at this time d/t closed cervix. Preeclampsia:  labs stable Fetal Wellbeing:  Category II, periods of minimal and moderate variability, of note received IV fentanyl  at 2150.  Ordered 500ml D5LR bolus. After patient repositioned to left side moderate variability returned and Category I tracing. Will start pitocin  titration since has short half-life and can be stopped quickly for fetal indications if needed.  Pain Control:  IV pain meds I/D:  GBS negative, intact Anticipated MOD:   NSVD  [name], CNM 08/19/2024, 11:37 PM

## 2024-08-20 ENCOUNTER — Encounter: Payer: Self-pay | Admitting: Anesthesiology

## 2024-08-20 ENCOUNTER — Encounter: Payer: Self-pay | Admitting: Obstetrics and Gynecology

## 2024-08-20 ENCOUNTER — Inpatient Hospital Stay: Admitting: Certified Registered"

## 2024-08-20 LAB — SYPHILIS: RPR W/REFLEX TO RPR TITER AND TREPONEMAL ANTIBODIES, TRADITIONAL SCREENING AND DIAGNOSIS ALGORITHM: RPR Ser Ql: NONREACTIVE

## 2024-08-20 MED ORDER — SIMETHICONE 80 MG PO CHEW: 80.0000 mg | CHEWABLE_TABLET | ORAL | Status: DC | PRN

## 2024-08-20 MED ORDER — FENTANYL-BUPIVACAINE-NACL 0.5-0.125-0.9 MG/250ML-% EP SOLN
12.0000 mL/h | EPIDURAL | Status: DC | PRN
  Administered 2024-08-20: 12 mL/h via EPIDURAL
  Filled 2024-08-20: qty 250

## 2024-08-20 MED ORDER — LIDOCAINE-EPINEPHRINE (PF) 1.5 %-1:200000 IJ SOLN
INTRAMUSCULAR | Status: DC | PRN
  Administered 2024-08-20: 5 mL via PERINEURAL

## 2024-08-20 MED ORDER — LACTATED RINGERS IV SOLN: INTRAVENOUS | Status: DC

## 2024-08-20 MED ORDER — ONDANSETRON HCL 4 MG PO TABS: 4.0000 mg | ORAL_TABLET | ORAL | Status: DC | PRN

## 2024-08-20 MED ORDER — DIPHENHYDRAMINE HCL 25 MG PO CAPS: 25.0000 mg | ORAL_CAPSULE | Freq: Four times a day (QID) | ORAL | Status: DC | PRN

## 2024-08-20 MED ORDER — BENZOCAINE-MENTHOL 20-0.5 % EX AERO
INHALATION_SPRAY | CUTANEOUS | Status: AC
  Filled 2024-08-20: qty 56

## 2024-08-20 MED ORDER — WITCH HAZEL-GLYCERIN EX PADS
1.0000 | MEDICATED_PAD | CUTANEOUS | Status: DC | PRN
  Administered 2024-08-21: 1 via TOPICAL
  Filled 2024-08-20: qty 100

## 2024-08-20 MED ORDER — LACTATED RINGERS IV SOLN
500.0000 mL | Freq: Once | INTRAVENOUS | Status: AC
  Administered 2024-08-20: 500 mL via INTRAVENOUS

## 2024-08-20 MED ORDER — OXYCODONE HCL 5 MG PO TABS
10.0000 mg | ORAL_TABLET | ORAL | Status: DC | PRN
  Administered 2024-08-21: 10 mg via ORAL
  Filled 2024-08-20: qty 2

## 2024-08-20 MED ORDER — BENZOCAINE-MENTHOL 20-0.5 % EX AERO: 1.0000 | INHALATION_SPRAY | CUTANEOUS | Status: DC | PRN

## 2024-08-20 MED ORDER — TRANEXAMIC ACID-NACL 1000-0.7 MG/100ML-% IV SOLN: 1000.0000 mg | Freq: Once | INTRAVENOUS | Status: DC

## 2024-08-20 MED ORDER — IBUPROFEN 600 MG PO TABS
600.0000 mg | ORAL_TABLET | Freq: Four times a day (QID) | ORAL | Status: DC
  Administered 2024-08-21 – 2024-08-22 (×7): 600 mg via ORAL
  Filled 2024-08-20 (×7): qty 1

## 2024-08-20 MED ORDER — DIPHENHYDRAMINE HCL 50 MG/ML IJ SOLN: 12.5000 mg | INTRAMUSCULAR | Status: DC | PRN

## 2024-08-20 MED ORDER — BUPIVACAINE HCL (PF) 0.25 % IJ SOLN
INTRAMUSCULAR | Status: DC | PRN
  Administered 2024-08-20 (×2): 4 mL via EPIDURAL

## 2024-08-20 MED ORDER — LACTATED RINGERS IV SOLN: 500.0000 mL | INTRAVENOUS | Status: DC | PRN

## 2024-08-20 MED ORDER — ONDANSETRON HCL 4 MG/2ML IJ SOLN: 4.0000 mg | INTRAMUSCULAR | Status: DC | PRN

## 2024-08-20 MED ORDER — ACETAMINOPHEN 325 MG PO TABS
650.0000 mg | ORAL_TABLET | ORAL | Status: DC | PRN
  Administered 2024-08-21: 650 mg via ORAL
  Filled 2024-08-20: qty 2

## 2024-08-20 MED ORDER — EPHEDRINE 5 MG/ML INJ: 10.0000 mg | INTRAVENOUS | Status: DC | PRN

## 2024-08-20 MED ORDER — PRENATAL MULTIVITAMIN CH
1.0000 | ORAL_TABLET | Freq: Every day | ORAL | Status: DC
  Administered 2024-08-21 – 2024-08-22 (×2): 1 via ORAL
  Filled 2024-08-20 (×2): qty 1

## 2024-08-20 MED ORDER — TRANEXAMIC ACID-NACL 1000-0.7 MG/100ML-% IV SOLN
INTRAVENOUS | Status: AC
  Administered 2024-08-20: 1000 mg
  Filled 2024-08-20: qty 100

## 2024-08-20 MED ORDER — COCONUT OIL OIL: 1.0000 | TOPICAL_OIL | Status: DC | PRN

## 2024-08-20 MED ORDER — DIBUCAINE (PERIANAL) 1 % EX OINT: 1.0000 | TOPICAL_OINTMENT | CUTANEOUS | Status: DC | PRN

## 2024-08-20 MED ORDER — LIDOCAINE HCL (PF) 1 % IJ SOLN
INTRAMUSCULAR | Status: DC | PRN
  Administered 2024-08-20: 20 mg

## 2024-08-20 MED ORDER — PHENYLEPHRINE 80 MCG/ML (10ML) SYRINGE FOR IV PUSH (FOR BLOOD PRESSURE SUPPORT): 80.0000 ug | PREFILLED_SYRINGE | INTRAVENOUS | Status: DC | PRN

## 2024-08-20 MED ORDER — TETANUS-DIPHTH-ACELL PERTUSSIS 5-2-15.5 LF-MCG/0.5 IM SUSP
0.5000 mL | Freq: Once | INTRAMUSCULAR | Status: AC
  Administered 2024-08-22: 0.5 mL via INTRAMUSCULAR
  Filled 2024-08-20: qty 0.5

## 2024-08-20 MED ORDER — HYDROXYZINE HCL 25 MG PO TABS
25.0000 mg | ORAL_TABLET | Freq: Four times a day (QID) | ORAL | Status: DC | PRN
  Administered 2024-08-20 – 2024-08-22 (×5): 25 mg via ORAL
  Filled 2024-08-20 (×5): qty 1

## 2024-08-20 MED ORDER — FERROUS SULFATE 325 (65 FE) MG PO TABS
325.0000 mg | ORAL_TABLET | Freq: Two times a day (BID) | ORAL | Status: DC
  Administered 2024-08-21 – 2024-08-22 (×3): 325 mg via ORAL
  Filled 2024-08-20 (×3): qty 1

## 2024-08-20 MED ORDER — OXYCODONE HCL 5 MG PO TABS: 5.0000 mg | ORAL_TABLET | ORAL | Status: DC | PRN

## 2024-08-20 MED ORDER — SENNOSIDES-DOCUSATE SODIUM 8.6-50 MG PO TABS
2.0000 | ORAL_TABLET | Freq: Every day | ORAL | Status: DC
  Administered 2024-08-21 – 2024-08-22 (×2): 2 via ORAL
  Filled 2024-08-20 (×2): qty 2

## 2024-08-20 NOTE — Progress Notes (Signed)
 Unable to titrate pitocin  due to tachysystole and fetal decelerations/lack of variability. Will continue to reposition pt and give fluid bolus as needed. Jathniel Smeltzer L. Kempton Milne, RN BSN 08/20/2024 3:49 AM

## 2024-08-20 NOTE — Progress Notes (Addendum)
 Labor Progress Note  Nicole Branch is a 24 y.o. G1P0000 at [redacted]w[redacted]d by LMP admitted for induction of labor due to IUGR.  Subjective: she reports severe anxiety and stress, requesting to speak with midwife about the plan of care again  Objective: BP 103/63 (BP Location: Left Arm)   Pulse 98   Temp 98.1 F (36.7 C) (Oral)   Resp 15   Ht 5' 2 (1.575 m)   Wt 74.4 kg   LMP 11/14/2023 (Exact Date)   BMI 30.00 kg/m  Notable VS details: reviewed  Fetal Assessment: FHT:  FHR: 140 bpm, variability: moderate,  accelerations:  Abscent,  decelerations:  Absent Category/reactivity:  Category II UC:   regular, every 1-2 minutes, uterine irritability present SVE:   cervical exam declined Membrane status: intact Amniotic color: n/a  Labs: Lab Results  Component Value Date   WBC 11.3 (H) 08/19/2024   HGB 10.1 (L) 08/19/2024   HCT 30.6 (L) 08/19/2024   MCV 74.6 (L) 08/19/2024   PLT 329 08/19/2024    Assessment / Plan: 24 year old G1P0 at [redacted]w[redacted]d with IOL for IUGR  Labor: Her pitocin  has run at 73mu/min for several hours which helps with cervical ripening when cytotec  is not a good option. Discussed checking her cervix to assess for labor progress and to try to place a [place] catheter but she declines, states she is too anxious. Discussed anxiety medications to help her relax, recommended Hydroxyzine . She has taken Hydroxyzine  before for anxiety and reports it helps and agrees to Hydroxyzine . Offered IV fentanyl  prior to [place] catheter placement to help with any discomfort she might feel and she declines the [place] catheter at this time. She asks what my recommendations are for her labor, and I again reviewed the recommendation of cervical exam and [place] catheter to help her labor progress. She declines again at this time. Will administer Hydroxyzine  for her anxiety. Dr. Verdon updated on patient's labor and fetal heart rate tracing. Preeclampsia:  labs stable Fetal Wellbeing:  Category II,  periods of minimal and moderate variability with the occasional late deceleration Pain Control:  offered IVPM prior to cervical exam and possible [place] catheter placement and she declines I/D:  GBS negative, intact  [name], CNM 08/20/2024, 6:15 AM

## 2024-08-20 NOTE — Progress Notes (Signed)
 Labor Progress Note  Nicole Branch is a 24 y.o. G1P0000 at [redacted]w[redacted]d by LMP admitted for induction of labor due to IUGR.  Subjective: Pt is comfortable with her epidural  Objective: BP 109/71   Pulse 78   Temp 97.7 F (36.5 C) (Oral)   Resp 16   Ht 5' 2 (1.575 m)   Wt 74.4 kg   LMP 11/14/2023 (Exact Date)   SpO2 99%   BMI 30.00 kg/m   Fetal Assessment: FHT:  FHR: 135 bpm, variability: moderate,  accelerations:  Abscent,  decelerations:  Present Variable Category/reactivity:  Category I and Category II UC:   regular, every 1-5 min SVE:    Dilation: 5cm  Effacement: 80%  Station:  -1  Consistency: soft  Position: anterior  Membrane status: SROM at 0815 Amniotic color: Clear  Labs: Lab Results  Component Value Date   WBC 11.3 (H) 08/19/2024   HGB 10.1 (L) 08/19/2024   HCT 30.6 (L) 08/19/2024   MCV 74.6 (L) 08/19/2024   PLT 329 08/19/2024    Assessment / Plan: Induction of labor due to IUGR 1215 0/thick/-3 1215 Cytotec  given 1645 0/thick/-3 1645 Cytotec  given 2140 0/thick/-3 0035 0/50/-3 0047 Pitocin  started 0817 1/80/-3 1145 Cook Cath placed  1230 Pitocin  restarted 1430 Cook Cath came out on its own 1519 5/80/-1 IUPC placed Pitocin  currently at 2mU  Labor: Progressing normally Preeclampsia:  109/71 Fetal Wellbeing:  Category I and Category II Pain Control:  Epidural I/D:  Afebrile, GBS neg, SROM x8hrs Anticipated MOD:  NSVD  Margery FORBES Coe, CNM 08/20/2024, 3:35 PM

## 2024-08-20 NOTE — Anesthesia Procedure Notes (Signed)
 Epidural Patient location during procedure: OB Start time: 08/20/2024 9:39 AM End time: 08/20/2024 10:29 AM  Staffing Anesthesiologist: Dario Barter, MD Resident/CRNA: Norleen Alberta HERO., CRNA Other anesthesia staff: Lacretia Camelia NOVAK, CRNA Performed: resident/CRNA   Preanesthetic Checklist Completed: patient identified, IV checked, site marked, risks and benefits discussed, surgical consent, monitors and equipment checked, pre-op evaluation and timeout performed  Epidural Patient position: sitting Prep: Betadine  Patient monitoring: heart rate, continuous pulse ox and blood pressure Approach: midline Location: L4-L5 Injection technique: LOR saline  Needle:  Needle type: Tuohy  Needle gauge: 18 G Needle length: 9 cm and 9 Needle insertion depth: 4 cm Catheter type: closed end flexible Catheter size: 20 Guage Catheter at skin depth: 9 cm Test dose: negative and 1.5% lidocaine  with Epi 1:200 K  Assessment Sensory level: T10 Events: blood not aspirated, no cerebrospinal fluid, injection not painful, no injection resistance, no paresthesia and negative IV test  Additional Notes   Patient tolerated the insertion well without complications. Pain rated 6/10 prior to placement. Epidural test dose and pain rated 0/10. Single attempt, to the side of tattoo was localization and catheter placement.Reason for block:procedure for pain

## 2024-08-20 NOTE — Discharge Summary (Shared)
 Postpartum Discharge Summary  Patient Name: Nicole Branch DOB: Feb 25, 2001 MRN: [id number]  Date of admission: 08/19/2024 Delivery date:08/20/2024 Delivering provider: MYRON NEST Date of discharge: 08/22/2024  [facility]: [facility] OFE:Ejupzwu'd last menstrual period was 11/14/2023 (exact date). EDC Estimated Date of Delivery: 08/20/24 Gestational Age at Delivery: [redacted]w[redacted]d   Admitting diagnosis: Encounter for planned induction of labor [Z34.90] Intrauterine pregnancy: [redacted]w[redacted]d     Secondary diagnosis:   Principal Problem:   Encounter for planned induction of labor   Discharge Diagnosis: Term Pregnancy Delivered and IUGR      Hospital course: Induction of Labor With Vaginal Delivery   24 y.o. yo G1P1001 at [redacted]w[redacted]d was admitted to the hospital 08/19/2024 for induction of labor.  Indication for induction: IUGR.  Patient had an labor course complicated by none Membrane Rupture Time/Date: 8:15 AM,08/20/2024  Delivery Method:Vaginal, Spontaneous Operative Delivery:N/A Episiotomy: None Lacerations:  Periurethral;2nd degree Details of delivery can be found in separate delivery note.  Patient had a postpartum course complicated by acute blood loss anemia. She was started on oral iron  supplements and given IV Venofer . Patient is discharged home 08/22/24.  Newborn Data: Birth date:08/20/2024 Birth time:7:26 PM Gender:Female Living status:Living Apgars:8 ,9  Weight:2450 g                                            Post partum procedures:IV Venofer  Induction:: Pitocin , Cytotec , and Cook cath Complications: None Delivery Type: spontaneous vaginal delivery Anesthesia: epidural anesthesia Placenta: spontaneous To Pathology: Yes   Prenatal Labs:  Blood type/Rh O POS  Antibody screen neg  Rubella Immune    Varicella Immune  RPR NR  HBsAg   NR  Hep C NR  HIV  NR   GC neg  Chlamydia neg  Genetic screening cfDNA negative  1 hour GTT 100  3 hour GTT N/a  GBS   Negative     Magnesium  Sulfate received: No BMZ received: No Rhophylac:was not indicated MMR: was not indicated Varivax vaccine given: was not indicated T-DaP:declined prenatally Flu: declined prenatally  Transfusion:Yes  Physical exam  Vitals:   08/21/24 0759 08/21/24 1639 08/22/24 0121 08/22/24 0832  BP: 112/68 118/76 121/81 103/71  Pulse: 100 94 100 70  Resp: 20 18 18 18   Temp: 97.7 F (36.5 C) 98.6 F (37 C) 98.3 F (36.8 C) 98.4 F (36.9 C)  TempSrc: Oral Oral  Oral  SpO2: 98%  96% 97%  Weight:      Height:       General: alert, cooperative, and no distress Lochia: appropriate Uterine Fundus: firm Perineum:minimal edema/repair well approximated DVT Evaluation: No evidence of DVT seen on physical exam. Negative Homan's sign. No cords or calf tenderness. No significant calf/ankle edema.  Labs: Lab Results  Component Value Date   WBC 12.1 (H) 08/21/2024   HGB 7.6 (L) 08/21/2024   HCT 23.5 (L) 08/21/2024   MCV 75.6 (L) 08/21/2024   PLT 238 08/21/2024      Latest Ref Rng & Units 06/12/2024    8:11 AM  CMP  Glucose 70 - 99 mg/dL 69   BUN 6 - 20 mg/dL 6   Creatinine 9.55 - 8.99 mg/dL 9.30   Sodium 864 - 854 mmol/L 137   Potassium 3.5 - 5.1 mmol/L 3.9   Chloride 98 - 111 mmol/L 107   CO2 22 - 32 mmol/L 22   Calcium   8.9 - 10.3 mg/dL 8.1    Edinburgh Score:    08/21/2024    3:51 AM  Edinburgh Postnatal Depression Scale Screening Tool  I have been able to laugh and see the funny side of things. 0  I have looked forward with enjoyment to things. 0  I have blamed myself unnecessarily when things went wrong. 1  I have been anxious or worried for no good reason. 1  I have felt scared or panicky for no good reason. 0  Things have been getting on top of me. 0  I have been so unhappy that I have had difficulty sleeping. 0  I have felt sad or miserable. 0  I have been so unhappy that I have been crying. 0  The thought of harming myself has occurred to me. 0   Edinburgh Postnatal Depression Scale Total 2    Risk assessment for postpartum VTE and prophylactic treatment: Very high risk factors: None High risk factors: None Moderate risk factors: None  Postpartum VTE prophylaxis with LMWH not indicated  After visit meds:  Allergies as of 08/22/2024       Reactions   Penicillins    Tolerated IV ancef . No adverse reactions   Amoxicillin Hives, Rash        Medication List     STOP taking these medications    acetaminophen  325 MG tablet Commonly known as: Tylenol    albuterol  (2.5 MG/3ML) 0.083% nebulizer solution Commonly known as: PROVENTIL    albuterol  108 (90 Base) MCG/ACT inhaler Commonly known as: VENTOLIN  HFA   docusate sodium  100 MG capsule Commonly known as: COLACE   enoxaparin  40 MG/0.4ML injection Commonly known as: LOVENOX    ondansetron  4 MG disintegrating tablet Commonly known as: ZOFRAN -ODT   oxyCODONE  5 MG immediate release tablet Commonly known as: Oxy IR/ROXICODONE    vitamin D3 25 MCG tablet Commonly known as: CHOLECALCIFEROL        TAKE these medications    benzocaine -Menthol  20-0.5 % Aero Commonly known as: DERMOPLAST Apply 1 Application topically as needed for irritation (perineal discomfort).   coconut oil Oil Apply 1 Application topically as needed.   dibucaine 1 % Oint Commonly known as: NUPERCAINAL Place 1 Application rectally as needed for hemorrhoids.   ferrous sulfate  325 (65 FE) MG tablet Take 1 tablet (325 mg total) by mouth 2 (two) times daily with a meal. What changed: when to take this   hydrOXYzine  25 MG tablet Commonly known as: ATARAX  Take 1 tablet (25 mg total) by mouth every 6 (six) hours as needed for anxiety. What changed:  how much to take how to take this when to take this reasons to take this additional instructions   ibuprofen  600 MG tablet Commonly known as: ADVIL  Take 1 tablet (600 mg total) by mouth every 6 (six) hours.   prenatal multivitamin Tabs  tablet Take 1 tablet by mouth daily at 12 noon. What changed:  medication strength when to take this   senna-docusate 8.6-50 MG tablet Commonly known as: Senokot-S Take 2 tablets by mouth daily. Start taking on: August 23, 2024   simethicone  80 MG chewable tablet Commonly known as: MYLICON Chew 1 tablet (80 mg total) by mouth as needed for flatulence.   witch hazel-glycerin  pad Commonly known as: TUCKS Apply 1 Application topically as needed for hemorrhoids.       Discharge home in stable condition Infant Feeding: Bottle Infant Disposition:home with mother Discharge instruction: per After Visit Summary and Postpartum booklet. Activity: Advance as tolerated. Pelvic rest  for 6 weeks.  Diet: routine diet Anticipated Birth Control: IUD Postpartum Appointment:6 weeks Additional Postpartum F/U: Postpartum Depression checkup Future Appointments:No future appointments. Follow up Visit:  Follow-up Information     Aisha Heller, CNM Follow up in 2 week(s).   Specialty: Obstetrics Why: mood check Contact information: [address] [place] [address] [phone]         Aisha Heller, CNM Follow up in 6 week(s).   Specialty: Obstetrics Why: postpartum visit Contact information: [address] [place] [address] [phone]                 Plan:  LAQUANDA BICK was discharged to home in good condition. Follow-up appointment as directed.    Signed: Heller Aisha CNM

## 2024-08-20 NOTE — Progress Notes (Signed)
 Labor Progress Note  Nicole Branch is a 24 y.o. G1P0000 at [redacted]w[redacted]d by LMP admitted for induction of labor due to IUGR.  Subjective: patient is requesting to speak with midwife and requesting cervical exam, reports her contractions are strong and she desires pain medication  Objective: BP 128/76 (BP Location: Right Arm)   Pulse (!) 104   Temp 98.2 F (36.8 C) (Oral)   Resp 16   Ht 5' 2 (1.575 m)   Wt 74.4 kg   LMP 11/14/2023 (Exact Date)   BMI 30.00 kg/m  Notable VS details: reviewed  Fetal Assessment: FHT:  FHR: 140 bpm, variability: moderate,  accelerations:  Abscent,  decelerations:  Absent Category/reactivity:  Category I UC:   irregular, every 1-4 minutes SVE:    Dilation: Closed  Effacement: 50%  Station:  -3  Consistency: soft  Position: posterior  Membrane status:intact Amniotic color: n/a  Labs: Lab Results  Component Value Date   WBC 11.3 (H) 08/19/2024   HGB 10.1 (L) 08/19/2024   HCT 30.6 (L) 08/19/2024   MCV 74.6 (L) 08/19/2024   PLT 329 08/19/2024   Assessment / Plan: 24 year old G1P0 at [redacted]w[redacted]d with IOL for IUGR  Labor: cervix has thinned some but still closed, will start pitocin , unable to place [place] catheter at this point. She is asking about the possibility of a cesarean section. Discussed that it can take a while to get into labor, and she may need a cesarean section at some point for various reasons, but she does not need a cesarean section at this point. She can request an elective cesarean section if that is what she desires. She verbalized understanding and agreement with the plan of care.  Preeclampsia:  labs stable Fetal Wellbeing:  Category I, reassuring moderate variability Pain Control:  asking about getting epidural, advised if she really wants the epidural she can get it, but she is not in labor yet, recommended waiting until contractions are stronger and she has some cervical dilation I/D:  GBS negative, intact Anticipated MOD:   NSVD  [name], CNM 08/20/2024, 12:41 AM

## 2024-08-20 NOTE — Progress Notes (Signed)
 Labor Progress Note  Nicole Branch is a 24 y.o. G1P0000 at [redacted]w[redacted]d by LMP admitted for induction of labor due to IUGR.  Subjective: Pt is comfortable with her epidural  Objective: BP 104/63 (BP Location: Left Arm)   Pulse 72   Temp 97.7 F (36.5 C) (Oral)   Resp 16   Ht 5' 2 (1.575 m)   Wt 74.4 kg   LMP 11/14/2023 (Exact Date)   BMI 30.00 kg/m   Fetal Assessment: FHT:  FHR: 135 bpm, variability: moderate,  accelerations:  Abscent,  decelerations:  Absent Category/reactivity:  Category I UC:   regular, every 2-3 min SVE:    Dilation: 1cm  Effacement: 80%  Station:  -3  Consistency: firm  Position: anterior  Membrane status: SROM at 0815 Amniotic color: Clear  Labs: Lab Results  Component Value Date   WBC 11.3 (H) 08/19/2024   HGB 10.1 (L) 08/19/2024   HCT 30.6 (L) 08/19/2024   MCV 74.6 (L) 08/19/2024   PLT 329 08/19/2024    Assessment / Plan: Induction of labor due to IUGR 1215 0/thick/-3 1215 Cytotec  given 1645 0/thick/-3 1645 Cytotec  given 2140 0/thick/-3 0035 0/50/-3 0047 Pitocin  started 0817 1/80/-3 1145 Cook Cath placed - plan to restart pitocin   Labor: Progressing normally Preeclampsia:  104/63 Fetal Wellbeing:  Category I Pain Control:  Epidural I/D:  Afebrile, GBS neg, SROM x4hrs Anticipated MOD:  NSVD  Margery FORBES Coe, CNM 08/20/2024, 12:31 PM

## 2024-08-20 NOTE — Anesthesia Preprocedure Evaluation (Signed)
"                                    Anesthesia Evaluation  Patient identified by MRN, date of birth, ID band Patient awake    Reviewed: Allergy & Precautions, H&P , Patient's Chart, lab work & pertinent test results  Airway Mallampati: II  TM Distance: >3 FB Neck ROM: full    Dental   Pulmonary asthma , Patient abstained from smoking., former smoker          Cardiovascular      Neuro/Psych   Anxiety     negative neurological ROS     GI/Hepatic Neg liver ROS,GERD  ,,  Endo/Other  negative endocrine ROS    Renal/GU negative Renal ROS  negative genitourinary   Musculoskeletal   Abdominal   Peds  Hematology  (+) Blood dyscrasia, anemia   Anesthesia Other Findings   Reproductive/Obstetrics (+) Pregnancy                              Anesthesia Physical Anesthesia Plan  ASA: 3  Anesthesia Plan: Epidural   Post-op Pain Management:    Induction:   PONV Risk Score and Plan:   Airway Management Planned:   Additional Equipment:   Intra-op Plan:   Post-operative Plan:   Informed Consent: I have reviewed the patients History and Physical, chart, labs and discussed the procedure including the risks, benefits and alternatives for the proposed anesthesia with the patient or authorized representative who has indicated his/her understanding and acceptance.       Plan Discussed with: CRNA and Anesthesiologist  Anesthesia Plan Comments:         Anesthesia Quick Evaluation  "

## 2024-08-21 ENCOUNTER — Encounter: Payer: Self-pay | Admitting: Obstetrics and Gynecology

## 2024-08-21 LAB — CBC
HCT: 23.5 % — ABNORMAL LOW (ref 36.0–46.0)
Hemoglobin: 7.6 g/dL — ABNORMAL LOW (ref 12.0–15.0)
MCH: 24.4 pg — ABNORMAL LOW (ref 26.0–34.0)
MCHC: 32.3 g/dL (ref 30.0–36.0)
MCV: 75.6 fL — ABNORMAL LOW (ref 80.0–100.0)
Platelets: 238 10*3/uL (ref 150–400)
RBC: 3.11 MIL/uL — ABNORMAL LOW (ref 3.87–5.11)
RDW: 15.3 % (ref 11.5–15.5)
WBC: 12.1 10*3/uL — ABNORMAL HIGH (ref 4.0–10.5)
nRBC: 0 % (ref 0.0–0.2)

## 2024-08-21 NOTE — Anesthesia Postprocedure Evaluation (Signed)
"   Anesthesia Post Note  Patient: INES REBEL  Procedure(s) Performed: AN AD HOC EPIDURAL  Patient location during evaluation: Mother Baby Anesthesia Type: Epidural Level of consciousness: awake and alert Pain management: pain level controlled Vital Signs Assessment: post-procedure vital signs reviewed and stable Respiratory status: spontaneous breathing, nonlabored ventilation and respiratory function stable Cardiovascular status: stable Postop Assessment: no headache, no backache and epidural receding Anesthetic complications: no   No notable events documented.   Last Vitals:  Vitals:   08/21/24 0351 08/21/24 0759  BP: (!) 97/53 112/68  Pulse: 87 100  Resp: 18 20  Temp: 36.7 C 36.5 C  SpO2: 99% 98%    Last Pain:  Vitals:   08/21/24 0759  TempSrc: Oral  PainSc:                  Windell Farr      "

## 2024-08-22 MED ORDER — WITCH HAZEL-GLYCERIN EX PADS: 1.0000 | MEDICATED_PAD | CUTANEOUS | Status: AC | PRN

## 2024-08-22 MED ORDER — PRENATAL MULTIVITAMIN CH: 1.0000 | ORAL_TABLET | Freq: Every day | ORAL | Status: AC

## 2024-08-22 MED ORDER — DIBUCAINE (PERIANAL) 1 % EX OINT: 1.0000 | TOPICAL_OINTMENT | CUTANEOUS | Status: AC | PRN

## 2024-08-22 MED ORDER — HYDROXYZINE HCL 25 MG PO TABS: 25.0000 mg | ORAL_TABLET | Freq: Four times a day (QID) | ORAL | 0 refills | Status: AC | PRN

## 2024-08-22 MED ORDER — SODIUM CHLORIDE 0.9 % IV BOLUS: 500.0000 mL | Freq: Once | INTRAVENOUS | Status: DC | PRN

## 2024-08-22 MED ORDER — ALBUTEROL SULFATE (2.5 MG/3ML) 0.083% IN NEBU: 2.5000 mg | INHALATION_SOLUTION | Freq: Once | RESPIRATORY_TRACT | Status: DC | PRN

## 2024-08-22 MED ORDER — METHYLPREDNISOLONE SODIUM SUCC 125 MG IJ SOLR: 125.0000 mg | Freq: Once | INTRAMUSCULAR | Status: DC | PRN

## 2024-08-22 MED ORDER — IBUPROFEN 600 MG PO TABS: 600.0000 mg | ORAL_TABLET | Freq: Four times a day (QID) | ORAL | 0 refills | Status: AC

## 2024-08-22 MED ORDER — SODIUM CHLORIDE 0.9 % IV SOLN: INTRAVENOUS | Status: DC | PRN

## 2024-08-22 MED ORDER — SODIUM CHLORIDE 0.9 % IV SOLN
300.0000 mg | Freq: Once | INTRAVENOUS | Status: DC
  Filled 2024-08-22: qty 15

## 2024-08-22 MED ORDER — COCONUT OIL OIL: 1.0000 | TOPICAL_OIL | Status: AC | PRN

## 2024-08-22 MED ORDER — FERROUS SULFATE 325 (65 FE) MG PO TABS: 325.0000 mg | ORAL_TABLET | Freq: Two times a day (BID) | ORAL | Status: AC

## 2024-08-22 MED ORDER — DIPHENHYDRAMINE HCL 50 MG/ML IJ SOLN: 25.0000 mg | Freq: Once | INTRAMUSCULAR | Status: DC | PRN

## 2024-08-22 MED ORDER — EPINEPHRINE 0.3 MG/0.3ML IJ SOAJ: 0.3000 mg | Freq: Once | INTRAMUSCULAR | Status: DC | PRN

## 2024-08-22 MED ORDER — SIMETHICONE 80 MG PO CHEW: 80.0000 mg | CHEWABLE_TABLET | ORAL | Status: AC | PRN

## 2024-08-22 MED ORDER — BENZOCAINE-MENTHOL 20-0.5 % EX AERO: 1.0000 | INHALATION_SPRAY | CUTANEOUS | Status: AC | PRN

## 2024-08-22 MED ORDER — SENNOSIDES-DOCUSATE SODIUM 8.6-50 MG PO TABS: 2.0000 | ORAL_TABLET | Freq: Every day | ORAL | Status: AC

## 2024-08-22 NOTE — Discharge Instructions (Signed)

## 2024-08-22 NOTE — Plan of Care (Signed)
 Patient will be discharged home with family.  Discharge instructions, when to follow up, and prescriptions reviewed with patient.  Patient verbalized understanding. Patient will be escorted out by RN. Elyn Sharps, RN 08/22/24 @1535 

## 2024-08-24 LAB — SURGICAL PATHOLOGY
